# Patient Record
Sex: Female | Born: 1940
Health system: Southern US, Community
[De-identification: ages and names within clinical notes are randomized; demographics above are authoritative.]

## PROBLEM LIST (undated history)

## (undated) DIAGNOSIS — I10 Essential (primary) hypertension: Secondary | ICD-10-CM

## (undated) DIAGNOSIS — M1990 Unspecified osteoarthritis, unspecified site: Secondary | ICD-10-CM

## (undated) DIAGNOSIS — R7303 Prediabetes: Secondary | ICD-10-CM

## (undated) DIAGNOSIS — G709 Myoneural disorder, unspecified: Secondary | ICD-10-CM

## (undated) HISTORY — PX: CARPAL TUNNEL RELEASE: SHX101

## (undated) HISTORY — PX: APPENDECTOMY: SHX54

## (undated) HISTORY — PX: OTHER SURGICAL HISTORY: SHX169

---

## 2015-06-15 ENCOUNTER — Encounter (HOSPITAL_COMMUNITY): Payer: Self-pay | Admitting: Oncology

## 2015-06-15 ENCOUNTER — Emergency Department (HOSPITAL_COMMUNITY)
Admission: EM | Admit: 2015-06-15 | Discharge: 2015-06-15 | Disposition: A | Discharge status: Home / Self Care | Payer: Medicare Other | Attending: Emergency Medicine | Admitting: Emergency Medicine

## 2015-06-15 DIAGNOSIS — Y9289 Other specified places as the place of occurrence of the external cause: Secondary | ICD-10-CM | POA: Diagnosis not present

## 2015-06-15 DIAGNOSIS — I10 Essential (primary) hypertension: Secondary | ICD-10-CM | POA: Insufficient documentation

## 2015-06-15 DIAGNOSIS — W268XXA Contact with other sharp object(s), not elsewhere classified, initial encounter: Secondary | ICD-10-CM | POA: Diagnosis not present

## 2015-06-15 DIAGNOSIS — S61213A Laceration without foreign body of left middle finger without damage to nail, initial encounter: Secondary | ICD-10-CM | POA: Diagnosis not present

## 2015-06-15 DIAGNOSIS — Z23 Encounter for immunization: Secondary | ICD-10-CM | POA: Insufficient documentation

## 2015-06-15 DIAGNOSIS — Z87891 Personal history of nicotine dependence: Secondary | ICD-10-CM | POA: Insufficient documentation

## 2015-06-15 DIAGNOSIS — Z8739 Personal history of other diseases of the musculoskeletal system and connective tissue: Secondary | ICD-10-CM | POA: Insufficient documentation

## 2015-06-15 DIAGNOSIS — Y9389 Activity, other specified: Secondary | ICD-10-CM | POA: Diagnosis not present

## 2015-06-15 DIAGNOSIS — S61219A Laceration without foreign body of unspecified finger without damage to nail, initial encounter: Secondary | ICD-10-CM

## 2015-06-15 DIAGNOSIS — Y998 Other external cause status: Secondary | ICD-10-CM | POA: Insufficient documentation

## 2015-06-15 HISTORY — DX: Unspecified osteoarthritis, unspecified site: M19.90

## 2015-06-15 HISTORY — DX: Essential (primary) hypertension: I10

## 2015-06-15 MED ORDER — TETANUS-DIPHTH-ACELL PERTUSSIS 5-2.5-18.5 LF-MCG/0.5 IM SUSP
0.5000 mL | Freq: Once | INTRAMUSCULAR | Status: AC
  Administered 2015-06-15: 0.5 mL via INTRAMUSCULAR
  Filled 2015-06-15: qty 0.5

## 2015-06-15 NOTE — ED Provider Notes (Signed)
CSN: [id number]     Arrival date & time 06/15/15  1849 History   First MD Initiated Contact with Patient 06/15/15 1945     Chief Complaint  Patient presents with  . Extremity Laceration    left middle finger     (Consider location/radiation/quality/duration/timing/severity/associated sxs/prior Treatment) HPI 75 year old female who presents with finger laceration. Right-hand-dominant with history of hypertension and arthritis. Does not take blood thinners. I was cleaning the blade of a blender when she accidentally neck herself over the left middle finger. States that she initially had some bleeding to her finger which stopped with pressure. No weakness, numbness, or overlying color changes to her skin. She is otherwise been in her usual state of health. Past Medical History  Diagnosis Date  . Hypertension   . Arthritis    Past Surgical History  Procedure Laterality Date  . Carpal tunnel release Left    History reviewed. No pertinent family history. Social History  Substance Use Topics  . Smoking status: Former Games developer  . Smokeless tobacco: Never Used  . Alcohol Use: Yes   OB History    No data available     Review of Systems  Constitutional: Negative for fever.  Skin: Positive for wound.  Allergic/Immunologic: Negative for immunocompromised state.  Neurological: Negative for numbness.  Hematological: Does not bruise/bleed easily.  All other systems reviewed and are negative.     Allergies  Review of patient's allergies indicates no known allergies.  Home Medications   Prior to Admission medications   Not on File   BP 164/87 mmHg  Pulse 78  Temp(Src) 98 F (36.7 C) (Oral)  Resp 20  Ht  (1.626 m)  Wt 117 lb (53.071 kg)  BMI 20.07 kg/m2  SpO2 97% Physical Exam Physical Exam  Nursing note and vitals reviewed. Constitutional: Well developed, well nourished, non-toxic, and in no acute distress Head: Normocephalic and atraumatic.  Mouth/Throat:  Oropharynx is clear and moist.  Neck: Normal range of motion. Neck supple.  Cardiovascular: +2 radial pulse, normal capillary refill distally  Pulmonary/Chest: Effort normal   Abdominal: Soft. Musculoskeletal: Normal range of motion of left middle finger.   Neurological: Alert, no facial droop, fluent speech, in tact motor and sensation distal to wound of the left middle finger and of the radial/median/unlar nerves of left hand Skin: Skin is warm and dry. 2 cm superficial laceration to the lateral aspect of the left middle finger. Psychiatric: Cooperative  ED Course  .Marland KitchenLaceration Repair Date/Time: 06/15/2015 8:20 PM Performed by: Crista Curb DUO Authorized by: Crista Curb DUO Consent: Verbal consent obtained. Risks and benefits: risks, benefits and alternatives were discussed Consent given by: patient Patient identity confirmed: verbally with patient Time out: Immediately prior to procedure a "time out" was called to verify the correct patient, procedure, equipment, support staff and site/side marked as required. Body area: upper extremity Location details: left long finger Laceration length: 2 cm Foreign bodies: no foreign bodies Tendon involvement: none Nerve involvement: none Vascular damage: yes Patient sedated: no Preparation: Patient was prepped and draped in the usual sterile fashion. Irrigation solution: tap water Irrigation method: tap Amount of cleaning: standard Debridement: none Degree of undermining: none Skin closure: glue Approximation: close Approximation difficulty: simple Dressing: bandaid. Patient tolerance: Patient tolerated the procedure well with no immediate complications   (including critical care time) Labs Review Labs Reviewed - No data to display  Imaging Review No results found. I have personally reviewed and evaluated these images and lab results as  part of my medical decision-making.   EKG Interpretation None      MDM   Final diagnoses:   Finger laceration, initial encounter    75 year old female who presents with laceration to the left middle finger prior to arrival. She is well-appearing acute distress. Vital signs unremarkable. She has a very superficial 2 cm laceration to the lateral aspect of the left middle finger. Neurovascularly intact distally. Does not probe into tendinous structures. No foreign bodies. Approximated with Dermabond. Tetanus is updated and she does not room for her last tetanus. Wound care instructions are reviewed. She expressed understanding of all discharge instructions felt comfortable to plan of care.   Lavera Guiseana Duo Liu, MD 06/15/15 2021

## 2015-06-15 NOTE — Discharge Instructions (Signed)
Nonsutured Laceration Care °A laceration is a cut that goes through all layers of the skin and extends into the tissue that is right under the skin. This type of cut is usually stitched up (sutured) or closed with tape (adhesive strips) or skin glue shortly after the injury happens. °However, if the wound is dirty or if several hours pass before medical treatment is provided, it is likely that germs (bacteria) will enter the wound. Closing a laceration after bacteria have entered it increases the risk of infection. In these cases, your health care provider may leave the laceration open (nonsutured) and cover it with a bandage. This type of treatment helps prevent infection and allows the wound to heal from the deepest layer of tissue damage up to the surface. °An open fracture is a type of injury that may involve nonsutured lacerations. An open fracture is a break in a bone that happens along with one or more lacerations through the skin that is near the fracture site. °HOW TO CARE FOR YOUR NONSUTURED LACERATION °· Take or apply over-the-counter and prescription medicines only as told by your health care provider. °· If you were prescribed an antibiotic medicine, take or apply it as told by your health care provider. Do not stop using the antibiotic even if your condition improves. °· Clean the wound one time each day or as told by your health care provider. °¨ Wash the wound with mild soap and water. °¨ Rinse the wound with water to remove all soap. °¨ Pat your wound dry with a clean towel. Do not rub the wound. °· Do not inject anything into the wound unless your health care provider told you to. °· Change any bandages (dressings) as told by your health care provider. This includes changing the dressing if it gets wet, dirty, or starts to smell bad. °· Keep the dressing dry until your health care provider says it can be removed. Do not take baths, swim, or do anything that puts your wound underwater until your  health care provider approves. °· Raise (elevate) the injured area above the level of your heart while you are sitting or lying down, if possible. °· Do not scratch or pick at the wound. °· Check your wound every day for signs of infection. Watch for: °¨ Redness, swelling, or pain. °¨ Fluid, blood, or pus. °· Keep all follow-up visits as told by your health care provider. This is important. °SEEK MEDICAL CARE IF: °· You received a tetanus and shot and you have swelling, severe pain, redness, or bleeding at the injection site.   °· You have a fever. °· Your pain is not controlled with medicine. °· You have increased redness, swelling, or pain at the site of your wound. °· You have fluid, blood, or pus coming from your wound. °· You notice a bad smell coming from your wound or your dressing. °· You notice something coming out of the wound, such as wood or glass. °· You notice a change in the color of your skin near your wound. °· You develop a new rash. °· You need to change the dressing frequently due to fluid, blood, or pus draining from the wound. °· You develop numbness around your wound. °SEEK IMMEDIATE MEDICAL CARE IF: °· Your pain suddenly increases and is severe. °· You develop severe swelling around the wound. °· The wound is on your hand or foot and you cannot properly move a finger or toe. °· The wound is on your hand or   foot and you notice that your fingers or toes look pale or bluish. °· You have a red streak going away from your wound. °  °This information is not intended to replace advice given to you by your health care provider. Make sure you discuss any questions you have with your health care provider. °  °Document Released: 01/13/2006 Document Revised: 07/02/2014 Document Reviewed: 02/11/2014 °Elsevier Interactive Patient Education ©2016 [employer] ° °

## 2015-06-15 NOTE — ED Notes (Signed)
Pt has a laceration to her left middle finger d/t cleaning the blades of a blender.  Bleeding is controled at this time.

## 2017-03-01 HISTORY — PX: OTHER SURGICAL HISTORY: SHX169

## 2017-07-30 HISTORY — PX: OTHER SURGICAL HISTORY: SHX169

## 2018-04-06 NOTE — H&P (Signed)
TOTAL KNEE ADMISSION H&P  Patient is being admitted for left total knee arthroplasty, anterior approach.  Subjective:  Chief Complaint:    Left knee primary OA / pain  HPI: Monique Morse, 78 y.o. female, has a history of pain and functional disability in the left knee due to arthritis and has failed non-surgical conservative treatments for greater than 12 weeks to include NSAID's and/or analgesics, corticosteriod injections and activity modification.  Onset of symptoms was gradual, starting 2+ years ago with gradually worsening course since that time. The patient noted prior procedures on the knee to include  arthroplasty on the right knee in [facility] Morse.  Patient currently rates pain in the left knee(s) at 7 out of 10 with activity. Patient has worsening of pain with activity and weight bearing, pain that interferes with activities of daily living, pain with passive range of motion, crepitus and joint swelling.  Patient has evidence of periarticular osteophytes and joint space narrowing by imaging studies. There is no active infection.  Risks, benefits and expectations were discussed with the patient.  Risks including but not limited to the risk of anesthesia, blood clots, nerve damage, blood vessel damage, failure of the prosthesis, infection and up to and including death.  Patient understand the risks, benefits and expectations and wishes to proceed with surgery.   PCP: No primary care provider on file.  D/C Plans:       Home   Post-op Meds:       No Rx given  Tranexamic Acid:      To be given - IV   Decadron:      Is to be given  FYI:      ASA   Norco  DME:    Rx given for - RW & 3-n-1  PT:    OPPT   Past Medical History:  Diagnosis Date  . Arthritis   . Hypertension     Past Surgical History:  Procedure Laterality Date  . CARPAL TUNNEL RELEASE Left     No current facility-administered medications for this encounter.    No current outpatient medications on file.   No  Known Allergies   Social History   Tobacco Use  . Smoking status: Former Games developer  . Smokeless tobacco: Never Used  Substance Use Topics  . Alcohol use: Yes       Review of Systems  Constitutional: Negative.   HENT: Negative.   Eyes: Negative.   Respiratory: Negative.   Cardiovascular: Negative.   Gastrointestinal: Negative.   Genitourinary: Negative.   Musculoskeletal: Positive for joint pain.  Skin: Negative.   Neurological: Negative.   Endo/Heme/Allergies: Negative.   Psychiatric/Behavioral: Negative.     Objective:  Physical Exam  Constitutional: She is oriented to person, place, and time. She appears well-developed.  HENT:  Head: Normocephalic.  Eyes: Pupils are equal, round, and reactive to light.  Neck: Neck supple. No JVD present. No tracheal deviation present. No thyromegaly present.  Cardiovascular: Normal rate, regular rhythm and intact distal pulses.  Respiratory: Effort normal and breath sounds normal. No respiratory distress. She has no wheezes.  GI: Soft. There is no abdominal tenderness. There is no guarding.  Musculoskeletal:     Left knee: She exhibits decreased range of motion, swelling and bony tenderness. She exhibits no ecchymosis, no deformity, no laceration and no erythema. Tenderness found.  Lymphadenopathy:    She has no cervical adenopathy.  Neurological: She is alert and oriented to person, place, and time.  Skin: Skin  is warm and dry.  Psychiatric: She has a normal mood and affect.     Labs:  Estimated body mass index is 20.08 kg/m as calculated from the following:   Height as of 06/15/15: 5\' 4"  (1.626 m).   Weight as of 06/15/15: 53.1 kg.   Imaging Review Plain radiographs demonstrate severe degenerative joint disease of the left knee.  The bone quality appears to be good for age and reported activity level.      Assessment/Plan:  End stage arthritis, left knee   The patient history, physical examination, clinical judgment  of the provider and imaging studies are consistent with end stage degenerative joint disease of the left knee(s) and total knee arthroplasty is deemed medically necessary. The treatment options including medical management, injection therapy arthroscopy and arthroplasty were discussed at length. The risks and benefits of total knee arthroplasty were presented and reviewed. The risks due to aseptic loosening, infection, stiffness, patella tracking problems, thromboembolic complications and other imponderables were discussed. The patient acknowledged the explanation, agreed to proceed with the plan and consent was signed. Patient is being admitted for inpatient treatment for surgery, pain control, PT, OT, prophylactic antibiotics, VTE prophylaxis, progressive ambulation and ADL's and discharge planning. The patient is planning to be discharged home.     Patient's anticipated LOS is less than 2 midnights, meeting these requirements: - Lives within 1 hour of care - Has a competent adult at home to recover with post-op recover - NO history of  - Chronic pain requiring opiods  - Diabetes  - Coronary Artery Disease  - Heart failure  - Heart attack  - Stroke  - DVT/VTE  - Cardiac arrhythmia  - Respiratory Failure/COPD  - Renal failure  - Anemia  - Advanced Liver disease    Anastasio Auerbach. Neko Boyajian   PA-C  04/06/2018, 2:53 PM

## 2018-04-24 ENCOUNTER — Other Ambulatory Visit (HOSPITAL_COMMUNITY): Payer: Self-pay | Admitting: *Deleted

## 2018-04-24 NOTE — Patient Instructions (Signed)
Monique Morse    Your procedure is scheduled on: 05-04-2018  Report to [facility] Main  Entrance  Report to SHORT STAY at 530  AM    Call this number if you have problems the morning of surgery [phone]    Remember: Do not eat food or drink liquids :After Midnight. BRUSH YOUR TEETH MORNING OF SURGERY AND RINSE YOUR MOUTH OUT, NO CHEWING GUM CANDY OR MINTS.     Take these medicines the morning of surgery with A SIP OF WATER: NONE                              You may not have any metal on your body including hair pins and              piercings  Do not wear jewelry, make-up, lotions, powders or perfumes, deodorant             Do not wear nail polish.  Do not shave  48 hours prior to surgery.              Men may shave face and neck.   Do not bring valuables to the hospital. [place] IS NOT             RESPONSIBLE   FOR VALUABLES.  Contacts, dentures or bridgework may not be worn into surgery.  Leave suitcase in the car. After surgery it may be brought to your room.      _____________________________________________________________________             [facility] - Preparing for Surgery Before surgery, you can play an important role.  Because skin is not sterile, your skin needs to be as free of germs as possible.  You can reduce the number of germs on your skin by washing with CHG (chlorahexidine gluconate) soap before surgery.  CHG is an antiseptic cleaner which kills germs and bonds with the skin to continue killing germs even after washing. Please DO NOT use if you have an allergy to CHG or antibacterial soaps.  If your skin becomes reddened/irritated stop using the CHG and inform your nurse when you arrive at Short Stay. Do not shave (including legs and underarms) for at least 48 hours prior to the first CHG shower.  You may shave your face/neck. Please follow these instructions carefully:  1.  Shower with CHG Soap the night before surgery and  the  morning of Surgery.  2.  If you choose to wash your hair, wash your hair first as usual with your  normal  shampoo.  3.  After you shampoo, rinse your hair and body thoroughly to remove the  shampoo.                           4.  Use CHG as you would any other liquid soap.  You can apply chg directly  to the skin and wash                       Gently with a scrungie or clean washcloth.  5.  Apply the CHG Soap to your body ONLY FROM THE NECK DOWN.   Do not use on face/ open  Wound or open sores. Avoid contact with eyes, ears mouth and genitals (private parts).                       Wash face,  Genitals (private parts) with your normal soap.             6.  Wash thoroughly, paying special attention to the area where your surgery  will be performed.  7.  Thoroughly rinse your body with warm water from the neck down.  8.  DO NOT shower/wash with your normal soap after using and rinsing off  the CHG Soap.                9.  Pat yourself dry with a clean towel.            10.  Wear clean pajamas.            11.  Place clean sheets on your bed the night of your first shower and do not  sleep with pets. Day of Surgery : Do not apply any lotions/deodorants the morning of surgery.  Please wear clean clothes to the hospital/surgery center.  FAILURE TO FOLLOW THESE INSTRUCTIONS MAY RESULT IN THE CANCELLATION OF YOUR SURGERY PATIENT SIGNATURE_________________________________  NURSE SIGNATURE__________________________________  ________________________________________________________________________   Adam Phenix  An incentive spirometer is a tool that can help keep your lungs clear and active. This tool measures how well you are filling your lungs with each breath. Taking long deep breaths may help reverse or decrease the chance of developing breathing (pulmonary) problems (especially infection) following:  A long period of time when you are unable to move or be  active. BEFORE THE PROCEDURE   If the spirometer includes an indicator to show your best effort, your nurse or respiratory therapist will set it to a desired goal.  If possible, sit up straight or lean slightly forward. Try not to slouch.  Hold the incentive spirometer in an upright position. INSTRUCTIONS FOR USE  1. Sit on the edge of your bed if possible, or sit up as far as you can in bed or on a chair. 2. Hold the incentive spirometer in an upright position. 3. Breathe out normally. 4. Place the mouthpiece in your mouth and seal your lips tightly around it. 5. Breathe in slowly and as deeply as possible, raising the piston or the ball toward the top of the column. 6. Hold your breath for 3-5 seconds or for as long as possible. Allow the piston or ball to fall to the bottom of the column. 7. Remove the mouthpiece from your mouth and breathe out normally. 8. Rest for a few seconds and repeat Steps 1 through 7 at least 10 times every 1-2 hours when you are awake. Take your time and take a few normal breaths between deep breaths. 9. The spirometer may include an indicator to show your best effort. Use the indicator as a goal to work toward during each repetition. 10. After each set of 10 deep breaths, practice coughing to be sure your lungs are clear. If you have an incision (the cut made at the time of surgery), support your incision when coughing by placing a pillow or rolled up towels firmly against it. Once you are able to get out of bed, walk around indoors and cough well. You may stop using the incentive spirometer when instructed by your caregiver.  RISKS AND COMPLICATIONS  Take your time so you do not get  dizzy or light-headed.  If you are in pain, you may need to take or ask for pain medication before doing incentive spirometry. It is harder to take a deep breath if you are having pain. AFTER USE  Rest and breathe slowly and easily.  It can be helpful to keep track of a log of  your progress. Your caregiver can provide you with a simple table to help with this. If you are using the spirometer at home, follow these instructions: [place] IF:   You are having difficultly using the spirometer.  You have trouble using the spirometer as often as instructed.  Your pain medication is not giving enough relief while using the spirometer.  You develop fever of 100.5 F (38.1 C) or higher. SEEK IMMEDIATE MEDICAL CARE IF:   You cough up bloody sputum that had not been present before.  You develop fever of 102 F (38.9 C) or greater.  You develop worsening pain at or near the incision site. MAKE SURE YOU:   Understand these instructions.  Will watch your condition.  Will get help right away if you are not doing well or get worse. Document Released: 06/28/2006 Document Revised: 05/10/2011 Document Reviewed: 08/29/2006 Exit[employer] Patient Information 2014 [employer], [place].   ________________________________________________________________________  WHAT IS A BLOOD TRANSFUSION? Blood Transfusion Information  A transfusion is the replacement of blood or some of its parts. Blood is made up of multiple cells which provide different functions.  Red blood cells carry oxygen and are used for blood loss replacement.  White blood cells fight against infection.  Platelets control bleeding.  Plasma helps clot blood.  Other blood products are available for specialized needs, such as hemophilia or other clotting disorders. BEFORE THE TRANSFUSION  Who gives blood for transfusions?   Healthy volunteers who are fully evaluated to make sure their blood is safe. This is blood bank blood. Transfusion therapy is the safest it has ever been in the practice of medicine. Before blood is taken from a donor, a complete history is taken to make sure that person has no history of diseases nor engages in risky social behavior (examples are intravenous drug use or sexual activity  with multiple partners). The donor's travel history is screened to minimize risk of transmitting infections, such as malaria. The donated blood is tested for signs of infectious diseases, such as HIV and hepatitis. The blood is then tested to be sure it is compatible with you in order to minimize the chance of a transfusion reaction. If you or a relative donates blood, this is often done in anticipation of surgery and is not appropriate for emergency situations. It takes many days to process the donated blood. RISKS AND COMPLICATIONS Although transfusion therapy is very safe and saves many lives, the main dangers of transfusion include:   Getting an infectious disease.  Developing a transfusion reaction. This is an allergic reaction to something in the blood you were given. Every precaution is taken to prevent this. The decision to have a blood transfusion has been considered carefully by your caregiver before blood is given. Blood is not given unless the benefits outweigh the risks. AFTER THE TRANSFUSION  Right after receiving a blood transfusion, you will usually feel much better and more energetic. This is especially true if your red blood cells have gotten low (anemic). The transfusion raises the level of the red blood cells which carry oxygen, and this usually causes an energy increase.  The nurse administering the transfusion will  monitor you carefully for complications. HOME CARE INSTRUCTIONS  No special instructions are needed after a transfusion. You may find your energy is better. Speak with your caregiver about any limitations on activity for underlying diseases you may have. SEEK MEDICAL CARE IF:   Your condition is not improving after your transfusion.  You develop redness or irritation at the intravenous (IV) site. SEEK IMMEDIATE MEDICAL CARE IF:  Any of the following symptoms occur over the next 12 hours:  Shaking chills.  You have a temperature by mouth above 102 F (38.9  C), not controlled by medicine.  Chest, back, or muscle pain.  People around you feel you are not acting correctly or are confused.  Shortness of breath or difficulty breathing.  Dizziness and fainting.  You get a rash or develop hives.  You have a decrease in urine output.  Your urine turns a dark color or changes to pink, red, or brown. Any of the following symptoms occur over the next 10 days:  You have a temperature by mouth above 102 F (38.9 C), not controlled by medicine.  Shortness of breath.  Weakness after normal activity.  The white part of the eye turns yellow (jaundice).  You have a decrease in the amount of urine or are urinating less often.  Your urine turns a dark color or changes to pink, red, or brown. Document Released: 02/13/2000 Document Revised: 05/10/2011 Document Reviewed: 10/02/2007 [employer] Patient Information 2014 [place], [place].  _______________________________________________________________________

## 2018-04-27 ENCOUNTER — Encounter (HOSPITAL_COMMUNITY)
Admission: RE | Admit: 2018-04-27 | Discharge: 2018-04-27 | Disposition: A | Discharge status: Home / Self Care | Payer: Medicare Other | Source: Ambulatory Visit | Attending: Orthopedic Surgery | Admitting: Orthopedic Surgery

## 2018-04-27 ENCOUNTER — Other Ambulatory Visit: Payer: Self-pay

## 2018-04-27 ENCOUNTER — Encounter (HOSPITAL_COMMUNITY): Payer: Self-pay

## 2018-04-27 ENCOUNTER — Inpatient Hospital Stay (HOSPITAL_COMMUNITY): Admission: RE | Admit: 2018-04-27 | Payer: Medicare Other | Source: Ambulatory Visit

## 2018-04-27 DIAGNOSIS — Z01812 Encounter for preprocedural laboratory examination: Secondary | ICD-10-CM | POA: Insufficient documentation

## 2018-04-27 DIAGNOSIS — M1712 Unilateral primary osteoarthritis, left knee: Secondary | ICD-10-CM | POA: Insufficient documentation

## 2018-04-27 LAB — CBC
HCT: 40.3 % (ref 36.0–46.0)
HEMOGLOBIN: 12.7 g/dL (ref 12.0–15.0)
MCH: 29.6 pg (ref 26.0–34.0)
MCHC: 31.5 g/dL (ref 30.0–36.0)
MCV: 93.9 fL (ref 80.0–100.0)
Platelets: 357 10*3/uL (ref 150–400)
RBC: 4.29 MIL/uL (ref 3.87–5.11)
RDW: 12.3 % (ref 11.5–15.5)
WBC: 8.5 10*3/uL (ref 4.0–10.5)
nRBC: 0 % (ref 0.0–0.2)

## 2018-04-27 LAB — SURGICAL PCR SCREEN
MRSA, PCR: NEGATIVE
Staphylococcus aureus: NEGATIVE

## 2018-04-27 LAB — ABO/RH: ABO/RH(D): O POS

## 2018-04-27 LAB — BASIC METABOLIC PANEL
Anion gap: 10 (ref 5–15)
BUN: 15 mg/dL (ref 8–23)
CALCIUM: 9.1 mg/dL (ref 8.9–10.3)
CHLORIDE: 104 mmol/L (ref 98–111)
CO2: 24 mmol/L (ref 22–32)
CREATININE: 0.78 mg/dL (ref 0.44–1.00)
GFR calc Af Amer: >60 mL/min (ref >60)
GFR calc non Af Amer: >60 mL/min (ref >60)
Glucose, Bld: 110 mg/dL — ABNORMAL HIGH (ref 70–99)
Potassium: 4.8 mmol/L (ref 3.5–5.1)
SODIUM: 138 mmol/L (ref 135–145)

## 2018-04-27 NOTE — Progress Notes (Signed)
PATIENT INSTRUCTED TO STOP ASPIRIN 81 MG 7 DAYS BEFORE SURGERY PER DR Marcheta Grammes

## 2018-05-03 NOTE — Anesthesia Preprocedure Evaluation (Addendum)
Anesthesia Evaluation  Patient identified by MRN, date of birth, ID band Patient awake    Reviewed: Allergy & Precautions, NPO status , Patient's Chart, lab work & pertinent test results  Airway Mallampati: II  TM Distance: >3 FB Neck ROM: Full    Dental no notable dental hx.    Pulmonary former smoker,    Pulmonary exam normal breath sounds clear to auscultation       Cardiovascular hypertension, Pt. on medications Normal cardiovascular exam Rhythm:Regular Rate:Normal  ECG: Normal   Neuro/Psych negative neurological ROS  negative psych ROS   GI/Hepatic negative GI ROS, Neg liver ROS,   Endo/Other  negative endocrine ROS  Renal/GU negative Renal ROS     Musculoskeletal  (+) Arthritis ,   Abdominal   Peds  Hematology negative hematology ROS (+)   Anesthesia Other Findings Left knee osteoarthritis  Reproductive/Obstetrics                            Anesthesia Physical Anesthesia Plan  ASA: II  Anesthesia Plan: Regional and Spinal   Post-op Pain Management:    Induction:   PONV Risk Score and Plan: 2 and Ondansetron, Dexamethasone, Treatment may vary due to age or medical condition and Midazolam  Airway Management Planned: Natural Airway  Additional Equipment:   Intra-op Plan:   Post-operative Plan:   Informed Consent: I have reviewed the patients History and Physical, chart, labs and discussed the procedure including the risks, benefits and alternatives for the proposed anesthesia with the patient or authorized representative who has indicated his/her understanding and acceptance.     Dental advisory given  Plan Discussed with: CRNA  Anesthesia Plan Comments:        Anesthesia Quick Evaluation

## 2018-05-04 ENCOUNTER — Encounter (HOSPITAL_COMMUNITY)
Admission: RE | Disposition: A | Discharge status: Home / Self Care | Payer: Self-pay | Source: Other Acute Inpatient Hospital | Attending: Orthopedic Surgery

## 2018-05-04 ENCOUNTER — Inpatient Hospital Stay (HOSPITAL_COMMUNITY): Payer: Medicare Other | Admitting: Anesthesiology

## 2018-05-04 ENCOUNTER — Other Ambulatory Visit: Payer: Self-pay

## 2018-05-04 ENCOUNTER — Encounter (HOSPITAL_COMMUNITY): Payer: Self-pay | Admitting: *Deleted

## 2018-05-04 ENCOUNTER — Observation Stay (HOSPITAL_COMMUNITY)
Admission: RE | Admit: 2018-05-04 | Discharge: 2018-05-05 | Disposition: A | Discharge status: Home / Self Care | Payer: Medicare Other | Source: Other Acute Inpatient Hospital | Attending: Orthopedic Surgery | Admitting: Orthopedic Surgery

## 2018-05-04 ENCOUNTER — Inpatient Hospital Stay (HOSPITAL_COMMUNITY): Payer: Medicare Other | Admitting: Physician Assistant

## 2018-05-04 DIAGNOSIS — I10 Essential (primary) hypertension: Secondary | ICD-10-CM | POA: Diagnosis not present

## 2018-05-04 DIAGNOSIS — M1712 Unilateral primary osteoarthritis, left knee: Principal | ICD-10-CM | POA: Insufficient documentation

## 2018-05-04 DIAGNOSIS — Z79899 Other long term (current) drug therapy: Secondary | ICD-10-CM | POA: Diagnosis not present

## 2018-05-04 DIAGNOSIS — R262 Difficulty in walking, not elsewhere classified: Secondary | ICD-10-CM | POA: Insufficient documentation

## 2018-05-04 DIAGNOSIS — Z87891 Personal history of nicotine dependence: Secondary | ICD-10-CM | POA: Insufficient documentation

## 2018-05-04 DIAGNOSIS — Z7982 Long term (current) use of aspirin: Secondary | ICD-10-CM | POA: Diagnosis not present

## 2018-05-04 DIAGNOSIS — Z96652 Presence of left artificial knee joint: Secondary | ICD-10-CM

## 2018-05-04 HISTORY — PX: TOTAL KNEE ARTHROPLASTY: SHX125

## 2018-05-04 LAB — TYPE AND SCREEN
ABO/RH(D): O POS
Antibody Screen: NEGATIVE

## 2018-05-04 SURGERY — ARTHROPLASTY, KNEE, TOTAL
Anesthesia: Regional | Site: Knee | Laterality: Left

## 2018-05-04 MED ORDER — BUPIVACAINE IN DEXTROSE 0.75-8.25 % IT SOLN
INTRATHECAL | Status: DC | PRN
  Administered 2018-05-04: 1.6 mL via INTRATHECAL

## 2018-05-04 MED ORDER — MIDAZOLAM HCL 2 MG/2ML IJ SOLN
INTRAMUSCULAR | Status: AC
  Filled 2018-05-04: qty 2

## 2018-05-04 MED ORDER — ACETAMINOPHEN 325 MG PO TABS: 325.0000 mg | ORAL_TABLET | Freq: Four times a day (QID) | ORAL | Status: DC | PRN

## 2018-05-04 MED ORDER — DIPHENHYDRAMINE HCL 12.5 MG/5ML PO ELIX: 12.5000 mg | ORAL_SOLUTION | ORAL | Status: DC | PRN

## 2018-05-04 MED ORDER — METOCLOPRAMIDE HCL 5 MG PO TABS: 5.0000 mg | ORAL_TABLET | Freq: Three times a day (TID) | ORAL | Status: DC | PRN

## 2018-05-04 MED ORDER — BUPIVACAINE HCL (PF) 0.25 % IJ SOLN
INTRAMUSCULAR | Status: AC
  Filled 2018-05-04: qty 30

## 2018-05-04 MED ORDER — METHOCARBAMOL 500 MG PO TABS
500.0000 mg | ORAL_TABLET | Freq: Four times a day (QID) | ORAL | Status: DC | PRN
  Administered 2018-05-04 (×2): 500 mg via ORAL
  Filled 2018-05-04 (×2): qty 1

## 2018-05-04 MED ORDER — SODIUM CHLORIDE (PF) 0.9 % IJ SOLN
INTRAMUSCULAR | Status: DC | PRN
  Administered 2018-05-04: 30 mL

## 2018-05-04 MED ORDER — DEXAMETHASONE SODIUM PHOSPHATE 10 MG/ML IJ SOLN
10.0000 mg | Freq: Once | INTRAMUSCULAR | Status: AC
  Administered 2018-05-04: 10 mg via INTRAVENOUS

## 2018-05-04 MED ORDER — ONDANSETRON HCL 4 MG PO TABS: 4.0000 mg | ORAL_TABLET | Freq: Four times a day (QID) | ORAL | Status: DC | PRN

## 2018-05-04 MED ORDER — METOCLOPRAMIDE HCL 5 MG/ML IJ SOLN: 5.0000 mg | Freq: Three times a day (TID) | INTRAMUSCULAR | Status: DC | PRN

## 2018-05-04 MED ORDER — ONDANSETRON HCL 4 MG/2ML IJ SOLN: 4.0000 mg | Freq: Once | INTRAMUSCULAR | Status: DC | PRN

## 2018-05-04 MED ORDER — FERROUS SULFATE 325 (65 FE) MG PO TABS
325.0000 mg | ORAL_TABLET | Freq: Two times a day (BID) | ORAL | Status: DC
  Administered 2018-05-05: 325 mg via ORAL
  Filled 2018-05-04: qty 1

## 2018-05-04 MED ORDER — CHLORHEXIDINE GLUCONATE 4 % EX LIQD: 60.0000 mL | Freq: Once | CUTANEOUS | Status: DC

## 2018-05-04 MED ORDER — SODIUM CHLORIDE 0.9 % IV SOLN
INTRAVENOUS | Status: DC
  Administered 2018-05-04 (×3): via INTRAVENOUS

## 2018-05-04 MED ORDER — SODIUM CHLORIDE 0.9 % IR SOLN
Status: DC | PRN
  Administered 2018-05-04 (×2): 1000 mL

## 2018-05-04 MED ORDER — LACTATED RINGERS IV SOLN
INTRAVENOUS | Status: DC
  Administered 2018-05-04 (×2): via INTRAVENOUS

## 2018-05-04 MED ORDER — KETOROLAC TROMETHAMINE 30 MG/ML IJ SOLN
INTRAMUSCULAR | Status: AC
  Filled 2018-05-04: qty 1

## 2018-05-04 MED ORDER — ONDANSETRON HCL 4 MG/2ML IJ SOLN
INTRAMUSCULAR | Status: DC | PRN
  Administered 2018-05-04: 4 mg via INTRAVENOUS

## 2018-05-04 MED ORDER — BUPIVACAINE-EPINEPHRINE (PF) 0.25% -1:200000 IJ SOLN
INTRAMUSCULAR | Status: DC | PRN
  Administered 2018-05-04: 30 mL

## 2018-05-04 MED ORDER — FENTANYL CITRATE (PF) 100 MCG/2ML IJ SOLN
INTRAMUSCULAR | Status: DC | PRN
  Administered 2018-05-04 (×2): 50 ug via INTRAVENOUS

## 2018-05-04 MED ORDER — BISACODYL 10 MG RE SUPP: 10.0000 mg | Freq: Every day | RECTAL | Status: DC | PRN

## 2018-05-04 MED ORDER — PHENOL 1.4 % MT LIQD
1.0000 | OROMUCOSAL | Status: DC | PRN
  Filled 2018-05-04: qty 177

## 2018-05-04 MED ORDER — PROPOFOL 10 MG/ML IV BOLUS
INTRAVENOUS | Status: AC
  Filled 2018-05-04: qty 60

## 2018-05-04 MED ORDER — FENTANYL CITRATE (PF) 100 MCG/2ML IJ SOLN
INTRAMUSCULAR | Status: AC
  Filled 2018-05-04: qty 2

## 2018-05-04 MED ORDER — KETOROLAC TROMETHAMINE 30 MG/ML IJ SOLN
INTRAMUSCULAR | Status: DC | PRN
  Administered 2018-05-04: 30 mg

## 2018-05-04 MED ORDER — POLYETHYLENE GLYCOL 3350 17 G PO PACK: 17.0000 g | PACK | Freq: Two times a day (BID) | ORAL | Status: DC

## 2018-05-04 MED ORDER — MIDAZOLAM HCL 5 MG/5ML IJ SOLN
INTRAMUSCULAR | Status: DC | PRN
  Administered 2018-05-04 (×2): 1 mg via INTRAVENOUS

## 2018-05-04 MED ORDER — CELECOXIB 200 MG PO CAPS
200.0000 mg | ORAL_CAPSULE | Freq: Two times a day (BID) | ORAL | Status: DC
  Administered 2018-05-04 – 2018-05-05 (×2): 200 mg via ORAL
  Filled 2018-05-04 (×2): qty 1

## 2018-05-04 MED ORDER — METHOCARBAMOL 500 MG IVPB - SIMPLE MED
INTRAVENOUS | Status: AC
  Filled 2018-05-04: qty 50

## 2018-05-04 MED ORDER — CEFAZOLIN SODIUM-DEXTROSE 2-4 GM/100ML-% IV SOLN
2.0000 g | Freq: Four times a day (QID) | INTRAVENOUS | Status: AC
  Administered 2018-05-04 (×2): 2 g via INTRAVENOUS
  Filled 2018-05-04 (×2): qty 100

## 2018-05-04 MED ORDER — DEXAMETHASONE SODIUM PHOSPHATE 10 MG/ML IJ SOLN
10.0000 mg | Freq: Once | INTRAMUSCULAR | Status: AC
  Administered 2018-05-05: 10 mg via INTRAVENOUS
  Filled 2018-05-04: qty 1

## 2018-05-04 MED ORDER — HYDROCODONE-ACETAMINOPHEN 7.5-325 MG PO TABS: 1.0000 | ORAL_TABLET | ORAL | Status: DC | PRN

## 2018-05-04 MED ORDER — CEFAZOLIN SODIUM-DEXTROSE 2-4 GM/100ML-% IV SOLN
2.0000 g | INTRAVENOUS | Status: AC
  Administered 2018-05-04: 2 g via INTRAVENOUS
  Filled 2018-05-04: qty 100

## 2018-05-04 MED ORDER — METHOCARBAMOL 500 MG IVPB - SIMPLE MED
500.0000 mg | Freq: Four times a day (QID) | INTRAVENOUS | Status: DC | PRN
  Administered 2018-05-04: 500 mg via INTRAVENOUS
  Filled 2018-05-04: qty 50

## 2018-05-04 MED ORDER — FENTANYL CITRATE (PF) 100 MCG/2ML IJ SOLN: 25.0000 ug | INTRAMUSCULAR | Status: DC | PRN

## 2018-05-04 MED ORDER — ROPIVACAINE HCL 5 MG/ML IJ SOLN
INTRAMUSCULAR | Status: DC | PRN
  Administered 2018-05-04: 30 mL via PERINEURAL

## 2018-05-04 MED ORDER — DEXAMETHASONE SODIUM PHOSPHATE 10 MG/ML IJ SOLN
INTRAMUSCULAR | Status: AC
  Filled 2018-05-04: qty 1

## 2018-05-04 MED ORDER — ONDANSETRON HCL 4 MG/2ML IJ SOLN
INTRAMUSCULAR | Status: AC
  Filled 2018-05-04: qty 2

## 2018-05-04 MED ORDER — MENTHOL 3 MG MT LOZG: 1.0000 | LOZENGE | OROMUCOSAL | Status: DC | PRN

## 2018-05-04 MED ORDER — ACETAMINOPHEN 500 MG PO TABS
1000.0000 mg | ORAL_TABLET | Freq: Once | ORAL | Status: AC
  Administered 2018-05-04: 1000 mg via ORAL
  Filled 2018-05-04: qty 2

## 2018-05-04 MED ORDER — ALUM & MAG HYDROXIDE-SIMETH 200-200-20 MG/5ML PO SUSP: 15.0000 mL | ORAL | Status: DC | PRN

## 2018-05-04 MED ORDER — DOCUSATE SODIUM 100 MG PO CAPS
100.0000 mg | ORAL_CAPSULE | Freq: Two times a day (BID) | ORAL | Status: DC
  Administered 2018-05-05: 100 mg via ORAL
  Filled 2018-05-04: qty 1

## 2018-05-04 MED ORDER — HYDROMORPHONE HCL 1 MG/ML IJ SOLN: 0.5000 mg | INTRAMUSCULAR | Status: DC | PRN

## 2018-05-04 MED ORDER — SODIUM CHLORIDE 0.9 % IV SOLN
INTRAVENOUS | Status: DC | PRN
  Administered 2018-05-04: 25 ug/min via INTRAVENOUS

## 2018-05-04 MED ORDER — MAGNESIUM CITRATE PO SOLN: 1.0000 | Freq: Once | ORAL | Status: DC | PRN

## 2018-05-04 MED ORDER — ONDANSETRON HCL 4 MG/2ML IJ SOLN: 4.0000 mg | Freq: Four times a day (QID) | INTRAMUSCULAR | Status: DC | PRN

## 2018-05-04 MED ORDER — ASPIRIN 81 MG PO CHEW
81.0000 mg | CHEWABLE_TABLET | Freq: Two times a day (BID) | ORAL | Status: DC
  Administered 2018-05-04 – 2018-05-05 (×2): 81 mg via ORAL
  Filled 2018-05-04 (×2): qty 1

## 2018-05-04 MED ORDER — TRANEXAMIC ACID-NACL 1000-0.7 MG/100ML-% IV SOLN
1000.0000 mg | INTRAVENOUS | Status: AC
  Administered 2018-05-04: 1000 mg via INTRAVENOUS
  Filled 2018-05-04: qty 100

## 2018-05-04 MED ORDER — HYDROCODONE-ACETAMINOPHEN 5-325 MG PO TABS
1.0000 | ORAL_TABLET | ORAL | Status: DC | PRN
  Administered 2018-05-04 – 2018-05-05 (×2): 1 via ORAL
  Filled 2018-05-04 (×3): qty 1

## 2018-05-04 MED ORDER — TRANEXAMIC ACID-NACL 1000-0.7 MG/100ML-% IV SOLN
1000.0000 mg | Freq: Once | INTRAVENOUS | Status: AC
  Administered 2018-05-04: 1000 mg via INTRAVENOUS
  Filled 2018-05-04: qty 100

## 2018-05-04 MED ORDER — PROPOFOL 500 MG/50ML IV EMUL
INTRAVENOUS | Status: DC | PRN
  Administered 2018-05-04: 75 ug/kg/min via INTRAVENOUS

## 2018-05-04 MED ORDER — SODIUM CHLORIDE (PF) 0.9 % IJ SOLN
INTRAMUSCULAR | Status: AC
  Filled 2018-05-04: qty 50

## 2018-05-04 SURGICAL SUPPLY — 61 items
ATTUNE MED ANAT PAT 35 KNEE (Knees) ×2 IMPLANT
ATTUNE MED ANAT PAT 35MM KNEE (Knees) ×1 IMPLANT
ATTUNE PSFEM LTSZ4 NARCEM KNEE (Femur) ×3 IMPLANT
ATTUNE PSRP INSR SZ4 8 KNEE (Insert) ×2 IMPLANT
ATTUNE PSRP INSR SZ4 8MM KNEE (Insert) ×1 IMPLANT
BAG ZIPLOCK 12X15 (MISCELLANEOUS) IMPLANT
BANDAGE ACE 6X5 VEL STRL LF (GAUZE/BANDAGES/DRESSINGS) ×3 IMPLANT
BASEPLATE TIBIAL ROTATING SZ 4 (Knees) ×3 IMPLANT
BLADE SAW SGTL 11.0X1.19X90.0M (BLADE) IMPLANT
BLADE SAW SGTL 13.0X1.19X90.0M (BLADE) ×3 IMPLANT
BLADE SURG SZ10 CARB STEEL (BLADE) ×6 IMPLANT
BOWL SMART MIX CTS (DISPOSABLE) ×3 IMPLANT
CEMENT HV SMART SET (Cement) ×6 IMPLANT
CHLORAPREP W/TINT 26ML (MISCELLANEOUS) ×6 IMPLANT
COVER SURGICAL LIGHT HANDLE (MISCELLANEOUS) ×3 IMPLANT
COVER WAND RF STERILE (DRAPES) IMPLANT
CUFF TOURN SGL QUICK 34 (TOURNIQUET CUFF) ×2
CUFF TRNQT CYL 34X4.125X (TOURNIQUET CUFF) ×1 IMPLANT
DECANTER SPIKE VIAL GLASS SM (MISCELLANEOUS) ×6 IMPLANT
DERMABOND ADVANCED (GAUZE/BANDAGES/DRESSINGS) ×2
DERMABOND ADVANCED .7 DNX12 (GAUZE/BANDAGES/DRESSINGS) ×1 IMPLANT
DRAPE U-SHAPE 47X51 STRL (DRAPES) ×3 IMPLANT
DRESSING AQUACEL AG SP 3.5X10 (GAUZE/BANDAGES/DRESSINGS) ×1 IMPLANT
DRSG AQUACEL AG SP 3.5X10 (GAUZE/BANDAGES/DRESSINGS) ×3
DURAPREP 26ML APPLICATOR (WOUND CARE) IMPLANT
ELECT REM PT RETURN 15FT ADLT (MISCELLANEOUS) ×3 IMPLANT
GLOVE BIO SURGEON STRL SZ 6 (GLOVE) ×3 IMPLANT
GLOVE BIOGEL PI IND STRL 6.5 (GLOVE) ×1 IMPLANT
GLOVE BIOGEL PI IND STRL 7.5 (GLOVE) ×1 IMPLANT
GLOVE BIOGEL PI IND STRL 8.5 (GLOVE) ×1 IMPLANT
GLOVE BIOGEL PI INDICATOR 6.5 (GLOVE) ×2
GLOVE BIOGEL PI INDICATOR 7.5 (GLOVE) ×2
GLOVE BIOGEL PI INDICATOR 8.5 (GLOVE) ×2
GLOVE ECLIPSE 8.0 STRL XLNG CF (GLOVE) ×3 IMPLANT
GLOVE ORTHO TXT STRL SZ7.5 (GLOVE) ×3 IMPLANT
GOWN STRL REUS W/ TWL LRG LVL3 (GOWN DISPOSABLE) ×1 IMPLANT
GOWN STRL REUS W/TWL 2XL LVL3 (GOWN DISPOSABLE) ×3 IMPLANT
GOWN STRL REUS W/TWL LRG LVL3 (GOWN DISPOSABLE) ×5 IMPLANT
HANDPIECE INTERPULSE COAX TIP (DISPOSABLE) ×2
HOLDER FOLEY CATH W/STRAP (MISCELLANEOUS) ×3 IMPLANT
MANIFOLD NEPTUNE II (INSTRUMENTS) ×3 IMPLANT
NDL SAFETY ECLIPSE 18X1.5 (NEEDLE) ×1 IMPLANT
NEEDLE HYPO 18GX1.5 SHARP (NEEDLE) ×2
NS IRRIG 1000ML POUR BTL (IV SOLUTION) ×3 IMPLANT
PACK TOTAL KNEE CUSTOM (KITS) ×3 IMPLANT
PIN FIX SIGMA HP QUICK REL (PIN) ×3 IMPLANT
PIN THREADED HEADED SIGMA (PIN) ×3 IMPLANT
PROTECTOR NERVE ULNAR (MISCELLANEOUS) ×3 IMPLANT
SET HNDPC FAN SPRY TIP SCT (DISPOSABLE) ×1 IMPLANT
SET PAD KNEE POSITIONER (MISCELLANEOUS) ×3 IMPLANT
SUT MNCRL AB 4-0 PS2 18 (SUTURE) ×3 IMPLANT
SUT STRATAFIX PDS+ 0 24IN (SUTURE) ×3 IMPLANT
SUT VIC AB 1 CT1 36 (SUTURE) ×3 IMPLANT
SUT VIC AB 2-0 CT1 27 (SUTURE) ×6
SUT VIC AB 2-0 CT1 TAPERPNT 27 (SUTURE) ×3 IMPLANT
SYR 3ML LL SCALE MARK (SYRINGE) ×3 IMPLANT
TRAY FOLEY BAG SILVER LF 14FR (CATHETERS) ×3 IMPLANT
TRAY FOLEY MTR SLVR 16FR STAT (SET/KITS/TRAYS/PACK) IMPLANT
WATER STERILE IRR 1000ML POUR (IV SOLUTION) ×6 IMPLANT
WRAP KNEE MAXI GEL POST OP (GAUZE/BANDAGES/DRESSINGS) ×3 IMPLANT
YANKAUER SUCT BULB TIP 10FT TU (MISCELLANEOUS) ×3 IMPLANT

## 2018-05-04 NOTE — Op Note (Signed)
NAME:  Monique Morse                      MEDICAL RECORD NO.:  [id number]                             FACILITY:  [facility]      PHYSICIAN:  Madlyn Frankel. Charlann Boxer, M.D.  DATE OF BIRTH:  09/08/1940      DATE OF PROCEDURE:  05/04/2018                                     OPERATIVE REPORT         PREOPERATIVE DIAGNOSIS:  Left knee osteoarthritis.      POSTOPERATIVE DIAGNOSIS:  Left knee osteoarthritis.      FINDINGS:  The patient was noted to have complete loss of cartilage and   bone-on-bone arthritis with associated osteophytes in the medial and patellofemoral compartments of   the knee with a significant synovitis and associated effusion.  The patient had failed months of conservative treatment including medications, injection therapy, activity modification.     PROCEDURE:  Left total knee replacement.      COMPONENTS USED:  DePuy Attune rotating platform posterior stabilized knee   system, a size 4n femur, 4 tibia, size 8 mm PS AOX insert, and 35 anatomic patellar   button.      SURGEON:  Madlyn Frankel. Charlann Boxer, M.D.      ASSISTANT:  Lanney Gins, PA-C.      ANESTHESIA:  Regional and Spinal.      SPECIMENS:  None.      COMPLICATION:  None.      DRAINS:  None.  EBL: <100cc      TOURNIQUET TIME:   Total Tourniquet Time Documented: Thigh (Left) - 25 minutes Total: Thigh (Left) - 25 minutes  .      The patient was stable to the recovery room.      INDICATION FOR PROCEDURE:  Monique Morse is a 78 y.o. female patient of   mine.  The patient had been seen, evaluated, and treated for months conservatively in the   office with medication, activity modification, and injections.  The patient had   radiographic changes of bone-on-bone arthritis with endplate sclerosis and osteophytes noted.  Based on the radiographic changes and failed conservative measures, the patient   decided to proceed with definitive treatment, total knee replacement.  Risks of infection, DVT, component failure,  need for revision surgery, neurovascular injury were reviewed in the office setting.  The postop course was reviewed stressing the efforts to maximize post-operative satisfaction and function.  Consent was obtained for benefit of pain   relief.      PROCEDURE IN DETAIL:  The patient was brought to the operative theater.   Once adequate anesthesia, preoperative antibiotics, 2 gm of Ancef,1 gm of Tranexamic Acid, and 10 mg of Decadron administered, the patient was positioned supine with a left thigh tourniquet placed.  The  left lower extremity was prepped and draped in sterile fashion.  A time-   out was performed identifying the patient, planned procedure, and the appropriate extremity.      The left lower extremity was placed in the [facility] leg holder.  The leg was   exsanguinated, tourniquet elevated to 250 mmHg.  A midline incision was   made  followed by median parapatellar arthrotomy.  Following initial   exposure, attention was first directed to the patella.  Precut   measurement was noted to be 21 mm.  I resected down to 13-14 mm and used a   35 anatomic patellar button to restore patellar height as well as cover the cut surface.      The lug holes were drilled and a metal shim was placed to protect the   patella from retractors and saw blade during the procedure.      At this point, attention was now directed to the femur.  The femoral   canal was opened with a drill, irrigated to try to prevent fat emboli.  An   intramedullary rod was passed at 3 degrees valgus, 9 mm of bone was   resected off the distal femur.  Following this resection, the tibia was   subluxated anteriorly.  Using the extramedullary guide, 2-3 mm of bone was resected off   the proximal medial tibia.  We confirmed the gap would be   stable medially and laterally with a size 6 spacer block as well as confirmed that the tibial cut was perpendicular in the coronal plane, checking with an alignment rod.      Once this  was done, I sized the femur to be a size 4 in the anterior-   posterior dimension, chose a narrow component based on medial and   lateral dimension.  The size 4 rotation block was then pinned in   position anterior referenced using the C-clamp to set rotation.  The   anterior, posterior, and  chamfer cuts were made without difficulty nor   notching making certain that I was along the anterior cortex to help   with flexion gap stability.      The final box cut was made off the lateral aspect of distal femur.      At this point, the tibia was sized to be a size 4.  The size 4 tray was   then pinned in position through the medial third of the tubercle,   drilled, and keel punched.  Trial reduction was now carried with a 4 femur,  4 tibia, a size 6 then to the 8 mm PS insert, and the 35 anatomic patella botton.  The knee was brought to full extension with good flexion stability with the patella   tracking through the trochlea without application of pressure.  Given   all these findings the trial components removed.  Final components were   opened and cement was mixed.  The knee was irrigated with normal saline solution and pulse lavage.  The synovial lining was   then injected with 30 cc of 0.25% Marcaine with epinephrine, 1 cc of Toradol and 30 cc of NS for a total of 61 cc.     Final implants were then cemented onto cleaned and dried cut surfaces of bone with the knee brought to extension with a size 8 mm PS trial insert.      Once the cement had fully cured, excess cement was removed   throughout the knee.  I confirmed that I was satisfied with the range of   motion and stability, and the final size 8 mm PS AOX insert was chosen.  It was   placed into the knee.      The tourniquet had been let down at 25 minutes.  No significant   hemostasis was required.  The extensor mechanism was then reapproximated  using #1 Vicryl and #1 Stratafix sutures with the knee   in flexion.  The   remaining  wound was closed with 2-0 Vicryl and running 4-0 Monocryl.   The knee was cleaned, dried, dressed sterilely using Dermabond and   Aquacel dressing.  The patient was then   brought to recovery room in stable condition, tolerating the procedure   well.   Please note that Physician Assistant, Lanney Gins, PA-C was present for the entirety of the case, and was utilized for pre-operative positioning, peri-operative retractor management, general facilitation of the procedure and for primary wound closure at the end of the case.              Madlyn Frankel Charlann Boxer, M.D.    05/04/2018 8:30 AM

## 2018-05-04 NOTE — Anesthesia Procedure Notes (Addendum)
Spinal  Patient location during procedure: OR Start time: 05/04/2018 7:10 AM End time: 05/04/2018 7:20 AM Staffing Anesthesiologist: Murvin Natal, MD Performed: anesthesiologist  Preanesthetic Checklist Completed: patient identified, site marked, surgical consent, pre-op evaluation, timeout performed, IV checked, risks and benefits discussed and monitors and equipment checked Spinal Block Patient position: sitting Prep: DuraPrep Patient monitoring: heart rate, continuous pulse ox and blood pressure Approach: left paramedian Location: L4-5 Injection technique: single-shot Needle Needle type: Pencan  Needle gauge: 24 G Needle length: 9 cm Assessment Sensory level: T10 Additional Notes Expiration date of kit checked and confirmed. Patient tolerated procedure well, without complications.

## 2018-05-04 NOTE — Evaluation (Signed)
Physical Therapy Evaluation Patient Details Name: Monique Morse MRN: [id number] DOB: 20-Apr-1940 Today's Date: 05/04/2018   History of Present Illness  Pt s/p L TKR and with hx of R THR and L TKR last year  Clinical Impression  Pt s/p L TKR and presents with decreased L LE strength/ROM and post op pain limiting functional mobility.  Pt should progress well to dc home with family assist.    Follow Up Recommendations Follow surgeon's recommendation for DC plan and follow-up therapies    Equipment Recommendations  None recommended by PT    Recommendations for Other Services       Precautions / Restrictions Precautions Precautions: Fall Restrictions Weight Bearing Restrictions: No Other Position/Activity Restrictions: WBAT      Mobility  Bed Mobility Overal bed mobility: Needs Assistance Bed Mobility: Supine to Sit     Supine to sit: Min guard     General bed mobility comments: cues for sequence  Transfers Overall transfer level: Needs assistance Equipment used: Rolling walker (2 wheeled) Transfers: Sit to/from Stand Sit to Stand: Min assist         General transfer comment: cues for LE management and use of UEs to self assist  Ambulation/Gait Ambulation/Gait assistance: Min assist Gait Distance (Feet): 90 Feet Assistive device: Rolling walker (2 wheeled) Gait Pattern/deviations: Step-to pattern;Decreased step length - right;Decreased step length - left;Shuffle;Trunk flexed Gait velocity: decr   General Gait Details: cues for sequence, posture and position from Rw  Stairs            Wheelchair Mobility    Modified Rankin (Stroke Patients Only)       Balance Overall balance assessment: Mild deficits observed, not formally tested                                           Pertinent Vitals/Pain Pain Assessment: 0-10 Pain Score: 5  Pain Location: L knee Pain Descriptors / Indicators: Aching;Sore Pain Intervention(s): Limited  activity within patient's tolerance;Monitored during session;Premedicated before session;Ice applied    Home Living Family/patient expects to be discharged to:: Private residence Living Arrangements: Spouse/significant other Available Help at Discharge: Family Type of Home: House Home Access: Stairs to enter Entrance Stairs-Rails: Doctor, general practice of Steps: 3 Home Layout: One level Home Equipment: Environmental consultant - 2 wheels;Bedside commode      Prior Function Level of Independence: Independent               Hand Dominance        Extremity/Trunk Assessment        Lower Extremity Assessment Lower Extremity Assessment: LLE deficits/detail    Cervical / Trunk Assessment Cervical / Trunk Assessment: Normal  Communication   Communication: No difficulties  Cognition Arousal/Alertness: Awake/alert Behavior During Therapy: WFL for tasks assessed/performed Overall Cognitive Status: Within Functional Limits for tasks assessed                                        General Comments      Exercises Total Joint Exercises Ankle Circles/Pumps: AROM;15 reps;Both;Supine   Assessment/Plan    PT Assessment Patient needs continued PT services  PT Problem List Decreased strength;Decreased range of motion;Decreased activity tolerance;Decreased mobility;Decreased knowledge of use of DME;Pain       PT Treatment Interventions DME instruction;Gait  training;Stair training;Functional mobility training;Therapeutic activities;Therapeutic exercise;Patient/family education    PT Goals (Current goals can be found in the Care Plan section)  Acute Rehab PT Goals Patient Stated Goal: Regain IND PT Goal Formulation: With patient Time For Goal Achievement: 05/11/18 Potential to Achieve Goals: Good    Frequency 7X/week   Barriers to discharge        Co-evaluation               AM-PAC PT "6 Clicks" Mobility  Outcome Measure Help needed turning from  your back to your side while in a flat bed without using bedrails?: A Little Help needed moving from lying on your back to sitting on the side of a flat bed without using bedrails?: A Little Help needed moving to and from a bed to a chair (including a wheelchair)?: A Little Help needed standing up from a chair using your arms (e.g., wheelchair or bedside chair)?: A Little Help needed to walk in hospital room?: A Little Help needed climbing 3-5 steps with a railing? : A Little 6 Click Score: 18    End of Session Equipment Utilized During Treatment: Gait belt Activity Tolerance: Patient tolerated treatment well Patient left: in chair;with call bell/phone within reach;with chair alarm set;with nursing/sitter in room Nurse Communication: Mobility status PT Visit Diagnosis: Difficulty in walking, not elsewhere classified (R26.2)    Time: 1435-1500 PT Time Calculation (min) (ACUTE ONLY): 25 min   Charges:   PT Evaluation $PT Eval Low Complexity: 1 Low          Mauro Kaufmann PT Acute Rehabilitation Services Pager [phone] Office [phone]   Heydy Montilla 05/04/2018, 3:13 PM

## 2018-05-04 NOTE — Anesthesia Postprocedure Evaluation (Signed)
Anesthesia Post Note  Patient: Monique Morse  Procedure(s) Performed: TOTAL KNEE ARTHROPLASTY (Left Knee)     Patient location during evaluation: PACU Anesthesia Type: Regional and Spinal Level of consciousness: oriented and awake and alert Pain management: pain level controlled Vital Signs Assessment: post-procedure vital signs reviewed and stable Respiratory status: spontaneous breathing, respiratory function stable and patient connected to nasal cannula oxygen Cardiovascular status: blood pressure returned to baseline and stable Postop Assessment: no headache, no backache, no apparent nausea or vomiting and spinal receding Anesthetic complications: no    Last Vitals:  Vitals:   05/04/18 1204 05/04/18 1306  BP: (!) 131/58 126/71  Pulse: 89 87  Resp: 16 14  Temp: 36.4 C 36.6 C  SpO2: 97% 96%    Last Pain:  Vitals:   05/04/18 1459  TempSrc:   PainSc: 5                  Cheryll Keisler P Makyna Niehoff

## 2018-05-04 NOTE — Interval H&P Note (Signed)
History and Physical Interval Note:  05/04/2018 6:36 AM  Monique Morse  has presented today for surgery, with the diagnosis of Left knee osteoarthritis  The various methods of treatment have been discussed with the patient and family. After consideration of risks, benefits and other options for treatment, the patient has consented to  Procedure(s): TOTAL KNEE ARTHROPLASTY (Left) as a surgical intervention .  The patient's history has been reviewed, patient examined, no change in status, stable for surgery.  I have reviewed the patient's chart and labs.  Questions were answered to the patient's satisfaction.     Shelda Pal

## 2018-05-04 NOTE — Transfer of Care (Signed)
Immediate Anesthesia Transfer of Care Note  Patient: Monique Morse  Procedure(s) Performed: TOTAL KNEE ARTHROPLASTY (Left Knee)  Patient Location: PACU  Anesthesia Type:Spinal  Level of Consciousness: awake, alert , oriented and patient cooperative  Airway & Oxygen Therapy: Patient Spontanous Breathing and Patient connected to face mask oxygen  Post-op Assessment: Report given to RN and Post -op Vital signs reviewed and stable  Post vital signs: stable  Last Vitals:  Vitals Value Taken Time  BP    Temp    Pulse 73 05/04/2018  8:59 AM  Resp 14 05/04/2018  8:59 AM  SpO2 100 % 05/04/2018  8:59 AM  Vitals shown include unvalidated device data.  Last Pain: There were no vitals filed for this visit.       Complications: No apparent anesthesia complications

## 2018-05-04 NOTE — Discharge Instructions (Signed)

## 2018-05-04 NOTE — Plan of Care (Signed)

## 2018-05-04 NOTE — Anesthesia Procedure Notes (Signed)
Procedure Name: MAC Date/Time: 05/04/2018 7:04 AM Performed by: Lissa Morales, CRNA Pre-anesthesia Checklist: Patient identified, Emergency Drugs available, Suction available, Patient being monitored and Timeout performed Patient Re-evaluated:Patient Re-evaluated prior to induction Oxygen Delivery Method: Simple face mask Placement Confirmation: positive ETCO2

## 2018-05-04 NOTE — Anesthesia Procedure Notes (Signed)
Anesthesia Regional Block: Adductor canal block   Pre-Anesthetic Checklist: ,, timeout performed, Correct Patient, Correct Site, Correct Laterality, Correct Procedure,, site marked, risks and benefits discussed, Surgical consent,  Pre-op evaluation,  At surgeon's request and post-op pain management  Laterality: Left  Prep: chloraprep       Needles:  Injection technique: Single-shot  Needle Type: Echogenic Stimulator Needle     Needle Length: 9cm  Needle Gauge: 21     Additional Needles:   Procedures:,,,, ultrasound used (permanent image in chart),,,,  Narrative:  Start time: 05/04/2018 6:40 AM End time: 05/04/2018 6:50 AM Injection made incrementally with aspirations every 5 mL.  Performed by: Personally  Anesthesiologist: Leonides Grills, MD  Additional Notes: Functioning IV was confirmed and monitors were applied. A time-out was performed. Hand hygiene and sterile gloves were used. The thigh was placed in a frog-leg position and prepped in a sterile fashion. A 72mm 21ga Arrow echogenic stimulator needle was placed using ultrasound guidance.  Negative aspiration and negative test dose prior to incremental administration of local anesthetic. The patient tolerated the procedure well.

## 2018-05-05 ENCOUNTER — Encounter (HOSPITAL_COMMUNITY): Payer: Self-pay | Admitting: Orthopedic Surgery

## 2018-05-05 DIAGNOSIS — M1712 Unilateral primary osteoarthritis, left knee: Secondary | ICD-10-CM | POA: Diagnosis not present

## 2018-05-05 LAB — BASIC METABOLIC PANEL
Anion gap: 6 (ref 5–15)
BUN: 14 mg/dL (ref 8–23)
CO2: 26 mmol/L (ref 22–32)
Calcium: 8.7 mg/dL — ABNORMAL LOW (ref 8.9–10.3)
Chloride: 108 mmol/L (ref 98–111)
Creatinine, Ser: 0.85 mg/dL (ref 0.44–1.00)
GFR calc Af Amer: >60 mL/min (ref >60)
GFR calc non Af Amer: >60 mL/min (ref >60)
Glucose, Bld: 107 mg/dL — ABNORMAL HIGH (ref 70–99)
Potassium: 4.1 mmol/L (ref 3.5–5.1)
Sodium: 140 mmol/L (ref 135–145)

## 2018-05-05 LAB — CBC
HCT: 32.7 % — ABNORMAL LOW (ref 36.0–46.0)
Hemoglobin: 10 g/dL — ABNORMAL LOW (ref 12.0–15.0)
MCH: 29.2 pg (ref 26.0–34.0)
MCHC: 30.6 g/dL (ref 30.0–36.0)
MCV: 95.3 fL (ref 80.0–100.0)
Platelets: 342 10*3/uL (ref 150–400)
RBC: 3.43 MIL/uL — ABNORMAL LOW (ref 3.87–5.11)
RDW: 12.2 % (ref 11.5–15.5)
WBC: 10 10*3/uL (ref 4.0–10.5)
nRBC: 0 % (ref 0.0–0.2)

## 2018-05-05 MED ORDER — HYDROCODONE-ACETAMINOPHEN 7.5-325 MG PO TABS: 1.0000 | ORAL_TABLET | ORAL | 0 refills | Status: DC | PRN

## 2018-05-05 MED ORDER — METHOCARBAMOL 500 MG PO TABS: 500.0000 mg | ORAL_TABLET | Freq: Four times a day (QID) | ORAL | 0 refills | Status: DC | PRN

## 2018-05-05 MED ORDER — FERROUS SULFATE 325 (65 FE) MG PO TABS: 325.0000 mg | ORAL_TABLET | Freq: Two times a day (BID) | ORAL | 3 refills | Status: DC

## 2018-05-05 MED ORDER — CELECOXIB 200 MG PO CAPS: 200.0000 mg | ORAL_CAPSULE | Freq: Two times a day (BID) | ORAL | 0 refills | Status: DC

## 2018-05-05 MED ORDER — ASPIRIN 81 MG PO CHEW: 81.0000 mg | CHEWABLE_TABLET | Freq: Two times a day (BID) | ORAL | 0 refills | Status: AC

## 2018-05-05 NOTE — Plan of Care (Signed)
Plan of care reviewed and discussed with the patient. 

## 2018-05-05 NOTE — Discharge Summary (Signed)
Physician Discharge Summary   Patient ID: [name] MRN: [id number] DOB/AGE: 1940/05/28 78 y.o.  Admit date: 05/04/2018 Discharge date: 05/05/2018  Primary Diagnosis: Left knee osteoarthritis   Admission Diagnoses:  Past Medical History:  Diagnosis Date  . Arthritis    OA  . Hypertension    Discharge Diagnoses:   Principal Problem:   S/P left TKA  Estimated body mass index is 20.55 kg/m as calculated from the following:   Height as of this encounter:  (1.6 m).   Weight as of this encounter: 52.6 kg.  Procedure:  Procedure(s) (LRB): TOTAL KNEE ARTHROPLASTY (Left)   Consults: None  HPI:  Monique Morse is a 78 y.o. female patient of   mine.  The patient had been seen, evaluated, and treated for months conservatively in the   office with medication, activity modification, and injections.  The patient had   radiographic changes of bone-on-bone arthritis with endplate sclerosis and osteophytes noted.  Based on the radiographic changes and failed conservative measures, the patient   decided to proceed with definitive treatment, total knee replacement.  Risks of infection, DVT, component failure, need for revision surgery, neurovascular injury were reviewed in the office setting.  The postop course was reviewed stressing the efforts to maximize post-operative satisfaction and function.  Consent was obtained for benefit of pain   relief.   Laboratory Data: Admission on 05/04/2018, Discharged on 05/05/2018  Component Date Value Ref Range Status  . ABO/RH(D) 04/27/2018    Final                   Value:O POS Performed at [facility], [address] [place], [place] [address]   . WBC 05/05/2018 10.0  4.0 - 10.5 K/uL Final  . RBC 05/05/2018 3.43* 3.87 - 5.11 MIL/uL Final  . Hemoglobin 05/05/2018 10.0* 12.0 - 15.0 g/dL Final  . HCT 21/30/8657 32.7* 36.0 - 46.0 % Final  . MCV 05/05/2018 95.3  80.0 - 100.0 fL Final  . MCH 05/05/2018 29.2  26.0 -  34.0 pg Final  . MCHC 05/05/2018 30.6  30.0 - 36.0 g/dL Final  . RDW 84/69/6295 12.2  11.5 - 15.5 % Final  . Platelets 05/05/2018 342  150 - 400 K/uL Final  . nRBC 05/05/2018 0.0  0.0 - 0.2 % Final   Performed at [facility], [address], [place], [place] [address]  . Sodium 05/05/2018 140  135 - 145 mmol/L Final  . Potassium 05/05/2018 4.1  3.5 - 5.1 mmol/L Final  . Chloride 05/05/2018 108  98 - 111 mmol/L Final  . CO2 05/05/2018 26  22 - 32 mmol/L Final  . Glucose, Bld 05/05/2018 107* 70 - 99 mg/dL Final  . BUN 24/40/1027 14  8 - 23 mg/dL Final  . Creatinine, Ser 05/05/2018 0.85  0.44 - 1.00 mg/dL Final  . Calcium 25/36/6440 8.7* 8.9 - 10.3 mg/dL Final  . GFR calc non Af Amer 05/05/2018 >60  >60 mL/min Final  . GFR calc Af Amer 05/05/2018 >60  >60 mL/min Final  . Anion gap 05/05/2018 6  5 - 15 Final   Performed at [facility], [address], [place], [place] [address]  Hospital Outpatient Visit on 04/27/2018  Component Date Value Ref Range Status  . WBC 04/27/2018 8.5  4.0 - 10.5 K/uL Final  . RBC 04/27/2018 4.29  3.87 - 5.11 MIL/uL Final  . Hemoglobin 04/27/2018 12.7  12.0 - 15.0 g/dL  Final  . HCT 04/27/2018 40.3  36.0 - 46.0 % Final  . MCV 04/27/2018 93.9  80.0 - 100.0 fL Final  . MCH 04/27/2018 29.6  26.0 - 34.0 pg Final  . MCHC 04/27/2018 31.5  30.0 - 36.0 g/dL Final  . RDW 94/80/1655 12.3  11.5 - 15.5 % Final  . Platelets 04/27/2018 357  150 - 400 K/uL Final  . nRBC 04/27/2018 0.0  0.0 - 0.2 % Final   Performed at [facility], [address], [place], [place] [address]  . Sodium 04/27/2018 138  135 - 145 mmol/L Final  . Potassium 04/27/2018 4.8  3.5 - 5.1 mmol/L Final  . Chloride 04/27/2018 104  98 - 111 mmol/L Final  . CO2 04/27/2018 24  22 - 32 mmol/L Final  . Glucose, Bld 04/27/2018 110* 70 - 99 mg/dL Final  . BUN 70/78/6754 15  8 - 23 mg/dL Final  . Creatinine, Ser 04/27/2018 0.78  0.44 - 1.00 mg/dL Final    . Calcium 49/20/1007 9.1  8.9 - 10.3 mg/dL Final  . GFR calc non Af Amer 04/27/2018 >60  >60 mL/min Final  . GFR calc Af Amer 04/27/2018 >60  >60 mL/min Final  . Anion gap 04/27/2018 10  5 - 15 Final   Performed at [facility], [address], [place], [place] [address]  . ABO/RH(D) 04/27/2018 O POS   Final  . Antibody Screen 04/27/2018 NEG   Final  . Sample Expiration 04/27/2018 05/07/2018   Final  . Extend sample reason 04/27/2018    Final                   Value:NO TRANSFUSIONS OR PREGNANCY IN THE PAST 3 MONTHS Performed at [facility], [address] [place], [place] [address]   . MRSA, PCR 04/27/2018 NEGATIVE  NEGATIVE Final  . Staphylococcus aureus 04/27/2018 NEGATIVE  NEGATIVE Final   Comment: (NOTE) The Xpert SA Assay (FDA approved for NASAL specimens in patients 73 years of age and older), is one component of a comprehensive surveillance program. It is not intended to diagnose infection nor to guide or monitor treatment. Performed at [facility], [address] [place], [place] [address]      X-Rays:No results found.  EKG:No orders found for this or any previous visit.   Hospital Course: Monique Morse is a 78 y.o. who was admitted to [facility]. They were brought to the operating room on 05/04/2018 and underwent Procedure(s): TOTAL KNEE ARTHROPLASTY.  Patient tolerated the procedure well and was later transferred to the recovery room and then to the orthopaedic floor for postoperative care. They were given PO and IV analgesics for pain control following their surgery. They were given 24 hours of postoperative antibiotics of  Anti-infectives (From admission, onward)   Start     Dose/Rate Route Frequency Ordered Stop   05/04/18 1400  ceFAZolin (ANCEF) IVPB 2g/100 mL premix     2 g 200 mL/hr over 30 Minutes Intravenous Every 6 hours 05/04/18 1013 05/04/18 2101   05/04/18 0600  ceFAZolin (ANCEF) IVPB  2g/100 mL premix     2 g 200 mL/hr over 30 Minutes Intravenous On call to O.R. 05/04/18 6415 05/04/18 0708     and started on DVT prophylaxis in the form of Aspirin.   PT and OT were ordered for total joint protocol. Discharge planning consulted to help with postop disposition and equipment needs.  Patient had  a good night on the evening of surgery. They started to get up OOB with therapy on POD #0. Pt was seen during rounds and was ready to go home pending progress with therapy. She worked with therapy on POD #1 and was meeting her goals. Pt was discharged to home later that day in stable condition.  Diet: Regular diet Activity: WBAT Follow-up: in 2 weeks Disposition: Home Discharged Condition: good   Discharge Instructions    Call MD / Call 911   Complete by:  As directed    If you experience chest pain or shortness of breath, CALL 911 and be transported to the hospital emergency room.  If you develope a fever above 101 F, pus (white drainage) or increased drainage or redness at the wound, or calf pain, call your surgeon's office.   Change dressing   Complete by:  As directed    Maintain surgical dressing until follow up in the clinic. If the edges start to pull up, may reinforce with tape. If the dressing is no longer working, may remove and cover with gauze and tape, but must keep the area dry and clean.  Call with any questions or concerns.   Change dressing   Complete by:  As directed    Maintain surgical dressing until follow up in the clinic. If the edges start to pull up, may reinforce with tape. If the dressing is no longer working, may remove and cover with gauze and tape, but must keep the area dry and clean.  Call with any questions or concerns.   Constipation Prevention   Complete by:  As directed    Drink plenty of fluids.  Prune juice may be helpful.  You may use a stool softener, such as Colace (over the counter) 100 mg twice a day.  Use MiraLax (over the counter) for  constipation as needed.   Diet - low sodium heart healthy   Complete by:  As directed    Discharge instructions   Complete by:  As directed    Maintain surgical dressing until follow up in the clinic. If the edges start to pull up, may reinforce with tape. If the dressing is no longer working, may remove and cover with gauze and tape, but must keep the area dry and clean.  [facility]. Call with any questions or concerns.   Increase activity slowly as tolerated   Complete by:  As directed    Weight bearing as tolerated with assist device (walker, cane, etc) as directed, use it as long as suggested by your surgeon or therapist, typically at least 4-6 weeks.   TED hose   Complete by:  As directed    Use stockings (TED hose) for 2 weeks on both leg(s).  You may remove them at night for sleeping.   TED hose   Complete by:  As directed    Use stockings (TED hose) for 2 weeks on both leg(s).  You may remove them at night for sleeping.     Allergies as of 05/05/2018   No Known Allergies     Medication List    STOP taking these medications   aspirin EC 81 MG tablet Replaced by:  aspirin 81 MG chewable tablet     TAKE these medications   aspirin 81 MG chewable tablet Chew 1 tablet (81 mg total) by mouth 2 (two) times daily for 28 days. Then resume normal dose. Replaces:  aspirin EC 81 MG  tablet   CALCIUM 600/VITAMIN D PO Take 1 tablet by mouth daily.   celecoxib 200 MG capsule Commonly known as:  CELEBREX Take 1 capsule (200 mg total) by mouth 2 (two) times daily.   ferrous sulfate 325 (65 FE) MG tablet Take 1 tablet (325 mg total) by mouth 2 (two) times daily with a meal.   HYDROcodone-acetaminophen 7.5-325 MG tablet Commonly known as:  NORCO Take 1-2 tablets by mouth every 4 (four) hours as needed for severe pain (pain score 7-10).   lisinopril 20 MG tablet Commonly known as:  PRINIVIL,ZESTRIL Take 20 mg by mouth daily.   methocarbamol 500  MG tablet Commonly known as:  ROBAXIN Take 1 tablet (500 mg total) by mouth every 6 (six) hours as needed for muscle spasms.            Discharge Care Instructions  (From admission, onward)         Start     Ordered   05/05/18 0000  Change dressing    Comments:  Maintain surgical dressing until follow up in the clinic. If the edges start to pull up, may reinforce with tape. If the dressing is no longer working, may remove and cover with gauze and tape, but must keep the area dry and clean.  Call with any questions or concerns.   05/05/18 0738   05/05/18 0000  Change dressing    Comments:  Maintain surgical dressing until follow up in the clinic. If the edges start to pull up, may reinforce with tape. If the dressing is no longer working, may remove and cover with gauze and tape, but must keep the area dry and clean.  Call with any questions or concerns.   05/05/18 9244         Follow-up Information    Durene Romans, MD. Schedule an appointment as soon as possible for a visit in 2 weeks.   Specialty:  Orthopedic Surgery Contact information: [address] [place] [address] [phone]           Signed: Dennie Bible, PA-C Orthopedic Surgery 05/05/2018, 2:37 PM

## 2018-05-05 NOTE — Care Management Obs Status (Signed)
MEDICARE OBSERVATION STATUS NOTIFICATION   Patient Details  Name: Monique Morse MRN: [id number] Date of Birth: 09/04/1940   Medicare Observation Status Notification Given:  Yes    Golda Acre, RN 05/05/2018, 9:36 AM

## 2018-05-05 NOTE — Progress Notes (Signed)
Physical Therapy Treatment Patient Details Name: Monique Morse MRN: [id number] DOB: Jun 12, 1940 Today's Date: 05/05/2018    History of Present Illness Pt s/p L TKR and with hx of R THR and L TKR last year    PT Comments    Pt motivated and progressing well with mobility.  Pt reviewed stairs, use of ice and home therex program - written instruction provided and reviewed.   Follow Up Recommendations  Follow surgeon's recommendation for DC plan and follow-up therapies     Equipment Recommendations  None recommended by PT    Recommendations for Other Services       Precautions / Restrictions Precautions Precautions: Fall Restrictions Weight Bearing Restrictions: No Other Position/Activity Restrictions: WBAT    Mobility  Bed Mobility Overal bed mobility: Needs Assistance Bed Mobility: Supine to Sit     Supine to sit: Supervision     General bed mobility comments: no physical assist  Transfers Overall transfer level: Needs assistance Equipment used: Rolling walker (2 wheeled) Transfers: Sit to/from Stand Sit to Stand: Supervision         General transfer comment: cues for LE management and use of UEs to self assist  Ambulation/Gait Ambulation/Gait assistance: Min guard;Supervision Gait Distance (Feet): 120 Feet Assistive device: Rolling walker (2 wheeled) Gait Pattern/deviations: Step-to pattern;Decreased step length - right;Decreased step length - left;Shuffle;Trunk flexed Gait velocity: decr   General Gait Details: cues for sequence, posture and position from Rw   Stairs Stairs: Yes Stairs assistance: Min assist Stair Management: One rail Left;Step to pattern;Forwards Number of Stairs: 4 General stair comments: cues for sequence; both hands on one rail   Wheelchair Mobility    Modified Rankin (Stroke Patients Only)       Balance Overall balance assessment: Mild deficits observed, not formally tested                                          Cognition Arousal/Alertness: Awake/alert Behavior During Therapy: WFL for tasks assessed/performed Overall Cognitive Status: Within Functional Limits for tasks assessed                                        Exercises Total Joint Exercises Ankle Circles/Pumps: AROM;15 reps;Both;Supine Quad Sets: AROM;Both;10 reps;Supine Heel Slides: AAROM;Left;10 reps;Supine Straight Leg Raises: AAROM;AROM;Left;10 reps;Supine Long Arc Quad: AAROM;AROM;Left;10 reps;Seated Knee Flexion: AAROM;AROM;Left;10 reps;Seated Goniometric ROM: AAROM L knee -5 - 85    General Comments        Pertinent Vitals/Pain Pain Assessment: 0-10 Pain Score: 5  Pain Location: L knee Pain Descriptors / Indicators: Aching;Sore Pain Intervention(s): Limited activity within patient's tolerance;Monitored during session;Premedicated before session;Ice applied    Home Living                      Prior Function            PT Goals (current goals can now be found in the care plan section) Acute Rehab PT Goals Patient Stated Goal: Regain IND PT Goal Formulation: With patient Time For Goal Achievement: 05/11/18 Potential to Achieve Goals: Good Progress towards PT goals: Progressing toward goals    Frequency    7X/week      PT Plan Current plan remains appropriate    Co-evaluation  AM-PAC PT "6 Clicks" Mobility   Outcome Measure  Help needed turning from your back to your side while in a flat bed without using bedrails?: None Help needed moving from lying on your back to sitting on the side of a flat bed without using bedrails?: None Help needed moving to and from a bed to a chair (including a wheelchair)?: A Little Help needed standing up from a chair using your arms (e.g., wheelchair or bedside chair)?: A Little Help needed to walk in hospital room?: A Little Help needed climbing 3-5 steps with a railing? : A Little 6 Click Score: 20    End  of Session Equipment Utilized During Treatment: Gait belt Activity Tolerance: Patient tolerated treatment well Patient left: in chair;with call bell/phone within reach;with chair alarm set Nurse Communication: Mobility status PT Visit Diagnosis: Difficulty in walking, not elsewhere classified (R26.2)     Time: 6010-9323 PT Time Calculation (min) (ACUTE ONLY): 38 min  Charges:  $Gait Training: 8-22 mins $Therapeutic Exercise: 23-37 mins                     Mauro Kaufmann PT Acute Rehabilitation Services Pager [phone] Office [phone]    Dellene Mcgroarty 05/05/2018, 9:34 AM

## 2018-05-05 NOTE — Progress Notes (Signed)
   Subjective: 1 Day Post-Op Procedure(s) (LRB): TOTAL KNEE ARTHROPLASTY (Left) Patient reports pain as mild.   Patient seen in rounds with Dr. Charlann Boxer. Patient is well, and has had no acute complaints or problems. No acute events overnight. Foley catheter removed, positive flatus.  We will continue therapy today.   Objective: Vital signs in last 24 hours: Temp:  [97.6 F (36.4 C)-98.3 F (36.8 C)] 98.3 F (36.8 C) (03/06 0535) Pulse Rate:  [62-89] 70 (03/06 0535) Resp:  [11-18] 18 (03/06 0535) BP: (104-142)/(58-79) 142/78 (03/06 0535) SpO2:  [96 %-100 %] 98 % (03/06 0535)  Intake/Output from previous day:  Intake/Output Summary (Last 24 hours) at 05/05/2018 0731 Last data filed at 05/05/2018 0600 Gross per 24 hour  Intake 3300.05 ml  Output 2325 ml  Net 975.05 ml     Intake/Output this shift: No intake/output data recorded.  Labs: Recent Labs    05/05/18 0445  HGB 10.0*   Recent Labs    05/05/18 0445  WBC 10.0  RBC 3.43*  HCT 32.7*  PLT 342   Recent Labs    05/05/18 0445  NA 140  K 4.1  CL 108  CO2 26  BUN 14  CREATININE 0.85  GLUCOSE 107*  CALCIUM 8.7*   No results for input(s): LABPT, INR in the last 72 hours.  Exam: General - Patient is Alert and Oriented Extremity - Neurologically intact Sensation intact distally Intact pulses distally Dorsiflexion/Plantar flexion intact Dressing - dressing C/D/I Motor Function - intact, moving foot and toes well on exam.   Past Medical History:  Diagnosis Date  . Arthritis    OA  . Hypertension     Assessment/Plan: 1 Day Post-Op Procedure(s) (LRB): TOTAL KNEE ARTHROPLASTY (Left) Principal Problem:   S/P left TKA  Estimated body mass index is 20.55 kg/m as calculated from the following:   Height as of this encounter: 5\' 3"  (1.6 m).   Weight as of this encounter: 52.6 kg. Advance diet Up with therapy D/C IV fluids   Patient's anticipated LOS is less than 2 midnights, meeting these  requirements: - Lives within 1 hour of care - Has a competent adult at home to recover with post-op recover - NO history of  - Chronic pain requiring opiods  - Diabetes  - Coronary Artery Disease  - Heart failure  - Heart attack  - Stroke  - DVT/VTE  - Cardiac arrhythmia  - Respiratory Failure/COPD  - Renal failure  - Anemia  - Advanced Liver disease   DVT Prophylaxis - Aspirin Weight bearing as tolerated. D/C O2 and pulse ox and try on room air.  Plan is to go Home after hospital stay. Plan for discharge today as long as she continues to meet goals with therapy. Scheduled for outpatient PT. Follow up in the office in 2 weeks.   Dennie Bible, PA-C Orthopedic Surgery 05/05/2018, 7:31 AM

## 2018-09-25 ENCOUNTER — Other Ambulatory Visit: Payer: Self-pay

## 2018-09-25 DIAGNOSIS — Z20822 Contact with and (suspected) exposure to covid-19: Secondary | ICD-10-CM

## 2018-09-27 LAB — NOVEL CORONAVIRUS, NAA: SARS-CoV-2, NAA: NOT DETECTED

## 2019-01-04 ENCOUNTER — Other Ambulatory Visit: Payer: Self-pay | Admitting: Family Medicine

## 2019-01-04 DIAGNOSIS — R5381 Other malaise: Secondary | ICD-10-CM

## 2019-01-10 ENCOUNTER — Other Ambulatory Visit: Payer: Self-pay | Admitting: Family Medicine

## 2019-01-10 DIAGNOSIS — Z1231 Encounter for screening mammogram for malignant neoplasm of breast: Secondary | ICD-10-CM

## 2019-01-10 DIAGNOSIS — E2839 Other primary ovarian failure: Secondary | ICD-10-CM

## 2019-04-02 ENCOUNTER — Other Ambulatory Visit: Payer: Self-pay

## 2019-04-02 ENCOUNTER — Ambulatory Visit
Admission: RE | Admit: 2019-04-02 | Discharge: 2019-04-02 | Disposition: A | Discharge status: Home / Self Care | Payer: Medicare Other | Source: Ambulatory Visit | Attending: Family Medicine | Admitting: Family Medicine

## 2019-04-02 DIAGNOSIS — E2839 Other primary ovarian failure: Secondary | ICD-10-CM

## 2019-04-02 DIAGNOSIS — Z1231 Encounter for screening mammogram for malignant neoplasm of breast: Secondary | ICD-10-CM

## 2020-03-06 ENCOUNTER — Other Ambulatory Visit: Payer: Self-pay | Admitting: Family Medicine

## 2020-03-06 DIAGNOSIS — Z1231 Encounter for screening mammogram for malignant neoplasm of breast: Secondary | ICD-10-CM

## 2020-04-16 ENCOUNTER — Ambulatory Visit: Payer: Medicare Other

## 2020-05-01 ENCOUNTER — Ambulatory Visit: Payer: Medicare Other

## 2020-06-18 ENCOUNTER — Ambulatory Visit
Admission: RE | Admit: 2020-06-18 | Discharge: 2020-06-18 | Disposition: A | Discharge status: Home / Self Care | Payer: Medicare Other | Source: Ambulatory Visit | Attending: Family Medicine | Admitting: Family Medicine

## 2020-06-18 ENCOUNTER — Other Ambulatory Visit: Payer: Self-pay

## 2020-06-18 DIAGNOSIS — Z1231 Encounter for screening mammogram for malignant neoplasm of breast: Secondary | ICD-10-CM

## 2020-06-19 ENCOUNTER — Other Ambulatory Visit: Payer: Self-pay | Admitting: Family Medicine

## 2020-06-19 DIAGNOSIS — R928 Other abnormal and inconclusive findings on diagnostic imaging of breast: Secondary | ICD-10-CM

## 2020-07-11 ENCOUNTER — Other Ambulatory Visit: Payer: Self-pay

## 2020-07-11 ENCOUNTER — Other Ambulatory Visit: Payer: Self-pay | Admitting: Family Medicine

## 2020-07-11 ENCOUNTER — Ambulatory Visit
Admission: RE | Admit: 2020-07-11 | Discharge: 2020-07-11 | Disposition: A | Discharge status: Home / Self Care | Payer: Medicare Other | Source: Ambulatory Visit | Attending: Family Medicine | Admitting: Family Medicine

## 2020-07-11 DIAGNOSIS — R928 Other abnormal and inconclusive findings on diagnostic imaging of breast: Secondary | ICD-10-CM

## 2020-07-11 DIAGNOSIS — N6489 Other specified disorders of breast: Secondary | ICD-10-CM

## 2021-05-12 ENCOUNTER — Other Ambulatory Visit: Payer: Self-pay | Admitting: Family Medicine

## 2021-05-12 DIAGNOSIS — Z1231 Encounter for screening mammogram for malignant neoplasm of breast: Secondary | ICD-10-CM

## 2021-06-19 ENCOUNTER — Ambulatory Visit
Admission: RE | Admit: 2021-06-19 | Discharge: 2021-06-19 | Disposition: A | Discharge status: Home / Self Care | Payer: Medicare Other | Source: Ambulatory Visit | Attending: Family Medicine | Admitting: Family Medicine

## 2021-06-19 ENCOUNTER — Ambulatory Visit: Payer: Medicare Other

## 2021-06-19 DIAGNOSIS — Z1231 Encounter for screening mammogram for malignant neoplasm of breast: Secondary | ICD-10-CM

## 2022-01-28 ENCOUNTER — Other Ambulatory Visit: Payer: Self-pay | Admitting: Family Medicine

## 2022-01-28 DIAGNOSIS — M810 Age-related osteoporosis without current pathological fracture: Secondary | ICD-10-CM

## 2022-06-16 ENCOUNTER — Other Ambulatory Visit: Payer: Self-pay | Admitting: Family Medicine

## 2022-06-16 DIAGNOSIS — Z1231 Encounter for screening mammogram for malignant neoplasm of breast: Secondary | ICD-10-CM

## 2022-06-22 ENCOUNTER — Ambulatory Visit: Payer: Medicare Other

## 2022-06-29 ENCOUNTER — Ambulatory Visit
Admission: RE | Admit: 2022-06-29 | Discharge: 2022-06-29 | Disposition: A | Discharge status: Home / Self Care | Payer: Medicare Other | Source: Ambulatory Visit | Attending: Family Medicine | Admitting: Family Medicine

## 2022-06-29 DIAGNOSIS — Z1231 Encounter for screening mammogram for malignant neoplasm of breast: Secondary | ICD-10-CM

## 2022-07-02 ENCOUNTER — Other Ambulatory Visit: Payer: Self-pay | Admitting: Family Medicine

## 2022-07-02 DIAGNOSIS — R928 Other abnormal and inconclusive findings on diagnostic imaging of breast: Secondary | ICD-10-CM

## 2022-07-10 ENCOUNTER — Ambulatory Visit
Admission: RE | Admit: 2022-07-10 | Discharge: 2022-07-10 | Disposition: A | Discharge status: Home / Self Care | Payer: Medicare Other | Source: Ambulatory Visit | Attending: Family Medicine | Admitting: Family Medicine

## 2022-07-10 DIAGNOSIS — R928 Other abnormal and inconclusive findings on diagnostic imaging of breast: Secondary | ICD-10-CM

## 2022-07-15 ENCOUNTER — Other Ambulatory Visit: Payer: Medicare Other

## 2022-11-30 DIAGNOSIS — C801 Malignant (primary) neoplasm, unspecified: Secondary | ICD-10-CM

## 2022-11-30 HISTORY — DX: Malignant (primary) neoplasm, unspecified: C80.1

## 2022-12-22 ENCOUNTER — Encounter (INDEPENDENT_AMBULATORY_CARE_PROVIDER_SITE_OTHER): Payer: Self-pay | Admitting: Otolaryngology

## 2022-12-24 ENCOUNTER — Ambulatory Visit
Admission: RE | Admit: 2022-12-24 | Discharge: 2022-12-24 | Disposition: A | Discharge status: Home / Self Care | Payer: Medicare Other | Source: Ambulatory Visit | Attending: Family Medicine | Admitting: Family Medicine

## 2022-12-24 DIAGNOSIS — M810 Age-related osteoporosis without current pathological fracture: Secondary | ICD-10-CM

## 2023-01-10 ENCOUNTER — Encounter (INDEPENDENT_AMBULATORY_CARE_PROVIDER_SITE_OTHER): Payer: Self-pay

## 2023-01-10 ENCOUNTER — Ambulatory Visit (INDEPENDENT_AMBULATORY_CARE_PROVIDER_SITE_OTHER): Payer: Medicare Other

## 2023-01-10 VITALS — Ht 60.0 in | Wt 112.0 lb

## 2023-01-10 DIAGNOSIS — H9011 Conductive hearing loss, unilateral, right ear, with unrestricted hearing on the contralateral side: Secondary | ICD-10-CM | POA: Diagnosis not present

## 2023-01-10 DIAGNOSIS — H6121 Impacted cerumen, right ear: Secondary | ICD-10-CM

## 2023-01-12 DIAGNOSIS — H9011 Conductive hearing loss, unilateral, right ear, with unrestricted hearing on the contralateral side: Secondary | ICD-10-CM | POA: Insufficient documentation

## 2023-01-12 DIAGNOSIS — H6121 Impacted cerumen, right ear: Secondary | ICD-10-CM | POA: Insufficient documentation

## 2023-01-12 NOTE — Progress Notes (Signed)
Patient ID: Monique Morse, female   DOB: 06/15/1940, 82 y.o.   MRN: [id number]  Cc: Right ear hearing loss  HPI:  Monique Morse is an 82 y.o. female who presents today complaining of clogging sensation in the right ear and muffled hearing.  The patient has been symptomatic over the past month.  She was recently noted to have right ear cerumen impaction.  Attempts to remove the cerumen impaction was unsuccessful.  Currently she denies any significant otalgia or otorrhea.  She has no previous ENT surgery.  She has never worn hearing aids.  Past Medical History:  Diagnosis Date   Arthritis    OA   Hypertension     Past Surgical History:  Procedure Laterality Date   CARPAL TUNNEL RELEASE Left    ECOPTIC PREGNANCY SURGERY  YRS AGO   X 2   RIGHT HIP REPLACMENT  03/2017   RIGHT KNEE REPLACEMENT  07/2017   TOTAL KNEE ARTHROPLASTY Left 05/04/2018   Procedure: TOTAL KNEE ARTHROPLASTY;  Surgeon: Durene Romans, MD;  Location: [facility];  Service: Orthopedics;  Laterality: Left;    Family History  Problem Relation Age of Onset   Breast cancer Daughter     Social History:  reports that she has quit smoking. Her smoking use included cigarettes. She has never used smokeless tobacco. She reports current alcohol use. She reports that she does not use drugs.  Allergies: No Known Allergies  Prior to Admission medications   Medication Sig Start Date End Date Taking? Authorizing Provider  alendronate (FOSAMAX) 10 MG tablet alendronate 10 mg tablet  TAKE 1 TABLET 30 MINUTES BEFORE 1ST FOOD/BEVERAGE/MEDICINE OF THE DAY WITH PLAIN WATER ONCE A DAY   Yes [provider]  Calcium Carbonate-Vitamin D (CALCIUM 600/VITAMIN D PO) Take 1 tablet by mouth daily.   Yes [provider]  lisinopril (PRINIVIL,ZESTRIL) 20 MG tablet Take 20 mg by mouth daily.   Yes [provider]  celecoxib (CELEBREX) 200 MG capsule Take 1 capsule (200 mg total) by mouth 2 (two) times daily. Patient  not taking: Reported on 01/10/2023 05/05/18   Cassandria Anger, PA-C  ferrous sulfate 325 (65 FE) MG tablet Take 1 tablet (325 mg total) by mouth 2 (two) times daily with a meal. Patient not taking: Reported on 01/10/2023 05/05/18   Cassandria Anger, PA-C  HYDROcodone-acetaminophen (NORCO) 7.5-325 MG tablet Take 1-2 tablets by mouth every 4 (four) hours as needed for severe pain (pain score 7-10). Patient not taking: Reported on 01/10/2023 05/05/18   Cassandria Anger, PA-C  methocarbamol (ROBAXIN) 500 MG tablet Take 1 tablet (500 mg total) by mouth every 6 (six) hours as needed for muscle spasms. Patient not taking: Reported on 01/10/2023 05/05/18   Cassandria Anger, PA-C   Height 5' (1.524 m), weight 112 lb (50.8 kg). Exam: General: Communicates without difficulty, well nourished, no acute distress. Head: Normocephalic, no evidence injury, no tenderness, facial buttresses intact without stepoff. Face/sinus: No tenderness to palpation and percussion. Facial movement is normal and symmetric. Eyes: PERRL, EOMI. No scleral icterus, conjunctivae clear. Neuro: CN II exam reveals vision grossly intact.  No nystagmus at any point of gaze. Ears: Auricles well formed without lesions.  Right ear cerumen impaction.  Nose: External evaluation reveals normal support and skin without lesions.  Dorsum is intact.  Anterior rhinoscopy reveals congested mucosa over anterior aspect of inferior turbinates and intact septum.  No purulence noted. Oral:  Oral cavity and oropharynx are intact, symmetric, without  erythema or edema.  Mucosa is moist without lesions. Neck: Full range of motion without pain.  There is no significant lymphadenopathy.  No masses palpable.  Thyroid bed within normal limits to palpation.  Parotid glands and submandibular glands equal bilaterally without mass.  Trachea is midline. Neuro:  CN 2-12 grossly intact.    Procedure: Right ear cerumen disimpaction Anesthesia: None Description: Under the operating  microscope, the cerumen is carefully removed with a combination of cerumen currette, alligator forceps, and suction catheters.  After the cerumen is removed, the TMs are noted to be normal.  No mass, erythema, or lesions. The patient tolerated the procedure well.    Assessment: 1.  Right ear cerumen impaction.  After the disimpaction procedure, her tympanic membrane's and middle ear spaces are noted to be normal. 2.  Likely transient right ear conductive hearing loss.  The patient reports restoration of her hearing after the disimpaction procedure.  She defers on a hearing test today.  Plan: 1.  Otomicroscopy with right ear cerumen disimpaction. 2.  The physical exam findings are reviewed with the patient. 3.  The patient will return for reevaluation in 6 months, sooner if needed.  Myrian Botello W Thadius Smisek 01/12/2023, 8:31 AM

## 2023-02-10 NOTE — H&P (Signed)
Patient's anticipated LOS is less than 2 midnights, meeting these requirements: - Younger than 35 - Lives within 1 hour of care - Has a competent adult at home to recover with post-op recover - NO history of  - Chronic pain requiring opiods  - Diabetes  - Coronary Artery Disease  - Heart failure  - Heart attack  - Stroke  - DVT/VTE  - Cardiac arrhythmia  - Respiratory Failure/COPD  - Renal failure  - Anemia  - Advanced Liver disease     Monique Morse is an 82 y.o. female.    Chief Complaint:right shoulder pain  HPI: Pt is a 82 y.o. female complaining of right shoulder pain for multiple years. Pain had continually increased since the beginning. X-rays in the clinic show end-stage arthritic changes of the right shoulder. Pt has tried various conservative treatments which have failed to alleviate their symptoms, including injections and therapy. Various options are discussed with the patient. Risks, benefits and expectations were discussed with the patient. Patient understand the risks, benefits and expectations and wishes to proceed with surgery.   PCP:  Deatra James, MD  D/C Plans: Home  PMH: Past Medical History:  Diagnosis Date   Arthritis    OA   Hypertension     PSH: Past Surgical History:  Procedure Laterality Date   CARPAL TUNNEL RELEASE Left    ECOPTIC PREGNANCY SURGERY  YRS AGO   X 2   RIGHT HIP REPLACMENT  03/2017   RIGHT KNEE REPLACEMENT  07/2017   TOTAL KNEE ARTHROPLASTY Left 05/04/2018   Procedure: TOTAL KNEE ARTHROPLASTY;  Surgeon: Durene Romans, MD;  Location: [facility];  Service: Orthopedics;  Laterality: Left;    Social History:  reports that she has quit smoking. Her smoking use included cigarettes. She has never used smokeless tobacco. She reports current alcohol use. She reports that she does not use drugs. BMI: Estimated body mass index is 21.87 kg/m as calculated from the following:   Height as of 01/10/23: 5' (1.524 m).   Weight as of  01/10/23: 50.8 kg.  No results found for: "ALBUMIN" Diabetes: Patient does not have a diagnosis of diabetes.     Smoking Status:      Allergies:  No Known Allergies  Medications: No current facility-administered medications for this encounter.   Current Outpatient Medications  Medication Sig Dispense Refill   alendronate (FOSAMAX) 10 MG tablet alendronate 10 mg tablet  TAKE 1 TABLET 30 MINUTES BEFORE 1ST FOOD/BEVERAGE/MEDICINE OF THE DAY WITH PLAIN WATER ONCE A DAY     Calcium Carbonate-Vitamin D (CALCIUM 600/VITAMIN D PO) Take 1 tablet by mouth daily.     celecoxib (CELEBREX) 200 MG capsule Take 1 capsule (200 mg total) by mouth 2 (two) times daily. (Patient not taking: Reported on 01/10/2023) 60 capsule 0   ferrous sulfate 325 (65 FE) MG tablet Take 1 tablet (325 mg total) by mouth 2 (two) times daily with a meal. (Patient not taking: Reported on 01/10/2023) 28 tablet 3   HYDROcodone-acetaminophen (NORCO) 7.5-325 MG tablet Take 1-2 tablets by mouth every 4 (four) hours as needed for severe pain (pain score 7-10). (Patient not taking: Reported on 01/10/2023) 60 tablet 0   lisinopril (PRINIVIL,ZESTRIL) 20 MG tablet Take 20 mg by mouth daily.     methocarbamol (ROBAXIN) 500 MG tablet Take 1 tablet (500 mg total) by mouth every 6 (six) hours as needed for muscle spasms. (Patient not taking: Reported on 01/10/2023) 40 tablet 0    No results  found for this or any previous visit (from the past 48 hours). No results found.  ROS: Pain with rom of the right upper extremity  Physical Exam: Alert and oriented 82 y.o. female in no acute distress Cranial nerves 2-12 intact Cervical spine: full rom with no tenderness, nv intact distally Chest: active breath sounds bilaterally, no wheeze rhonchi or rales Heart: regular rate and rhythm, no murmur Abd: non tender non distended with active bowel sounds Hip is stable with rom  Right shoulder painful and weak rom Nv intact distally No rashes  or edema distally  Assessment/Plan Assessment: right shoulder cuff arthropathy  Plan:  Patient will undergo a right reverse total shoulder by Dr. Ranell Patrick at [facility] Risks benefits and expectations were discussed with the patient. Patient understand risks, benefits and expectations and wishes to proceed. Preoperative templating of the joint replacement has been completed, documented, and submitted to the Operating Room personnel in order to optimize intra-operative equipment management.   Alphonsa Overall PA-C, MPAS [facility] is now [employer] [address], [place], [place] [address] Phone: [phone] [email or web address] Facebook  [employer]

## 2023-03-05 NOTE — Patient Instructions (Signed)
 SURGICAL WAITING ROOM VISITATION Patients having surgery or a procedure may have no more than 2 support people in the waiting area - these visitors may rotate in the visitor waiting room.   Due to an increase in RSV and influenza rates and associated hospitalizations, children ages 18 and under may not visit patients in [facility] hospitals. If the patient needs to stay at the hospital during part of their recovery, the visitor guidelines for inpatient rooms apply.  PRE-OP VISITATION  Pre-op nurse will coordinate an appropriate time for 1 support person to accompany the patient in pre-op.  This support person may not rotate.  This visitor will be contacted when the time is appropriate for the visitor to come back in the pre-op area.  Please refer to the [facility] website for the visitor guidelines for Inpatients (after your surgery is over and you are in a regular room).  You are not required to quarantine at this time prior to your surgery. However, you must do this: Hand Hygiene often Do NOT share personal items Notify your provider if you are in close contact with someone who has COVID or you develop fever 100.4 or greater, new onset of sneezing, cough, sore throat, shortness of breath or body aches.  If you test positive for Covid or have been in contact with anyone that has tested positive in the last 10 days please notify you surgeon.    Your procedure is scheduled on:  FRIDAY  March 11, 2023  Report to [facility] Main Entrance: [address] entrance where the [employer] is available.   Report to admitting at: 05:15    AM  Call this number if you have any questions or problems the morning of surgery [phone]  Do not eat food after Midnight the night prior to your surgery/procedure.  After Midnight you may have the following liquids until 04:30 AM DAY OF SURGERY  Clear Liquid Diet Water Black Coffee (sugar ok, NO MILK/CREAM OR CREAMERS)  Tea (sugar ok, NO  MILK/CREAM OR CREAMERS) regular and decaf                             Plain Jell-O  with no fruit (NO RED)                                           Fruit ices (not with fruit pulp, NO RED)                                     Popsicles (NO RED)                                                                  Juice: NO CITRUS JUICES: only apple, WHITE grape, WHITE cranberry Sports drinks like Gatorade or Powerade (NO RED)                    The day of surgery:  Drink ONE (1) Pre-Surgery G2 at  04:30 AM the morning of surgery. Drink in  one sitting. Do not sip.  This drink was given to you during your hospital pre-op appointment visit. Nothing else to drink after completing the Pre-Surgery  G2 : No candy, chewing gum or throat lozenges.    FOLLOW ANY ADDITIONAL PRE OP INSTRUCTIONS YOU RECEIVED FROM YOUR SURGEON'S OFFICE!!!   Oral Hygiene is also important to reduce your risk of infection.        Remember - BRUSH YOUR TEETH THE MORNING OF SURGERY WITH YOUR REGULAR TOOTHPASTE  Do NOT smoke after Midnight the night before surgery.  STOP TAKING all Vitamins, Herbs and supplements 1 week before your  surgery.   STOP TAKING Aspirin  5-7 days before your surgery.   Take ONLY these medicines the morning of surgery with A SIP OF WATER:   None                   You may not have any metal on your body including hair pins, jewelry, and body piercing  Do not wear make-up, lotions, powders, perfumes  or deodorant  Do not wear nail polish including gel and S&S, artificial / acrylic nails, or any other type of covering on natural nails including finger and toenails. If you have artificial nails, gel coating, etc., that needs to be removed by a nail salon, Please have this removed prior to surgery. Not doing so may mean that your surgery could be cancelled or delayed if the Surgeon or anesthesia staff feels like they are unable to monitor you safely.   Do not shave 48 hours prior to surgery to avoid  nicks in your skin which may contribute to postoperative infections.   Contacts, Hearing Aids, dentures or bridgework may not be worn into surgery. DENTURES WILL BE REMOVED PRIOR TO SURGERY PLEASE DO NOT APPLY Poly grip OR ADHESIVES!!!  Patients discharged on the day of surgery will not be allowed to drive home.  Someone NEEDS to stay with you for the first 24 hours after anesthesia.  Do not bring your home medications to the hospital. The Pharmacy will dispense medications listed on your medication list to you during your admission in the Hospital.  Special Instructions: Bring a copy of your healthcare power of attorney and living will documents the day of surgery, if you wish to have them scanned into your [facility] Records- EPIC  Please read over the following fact sheets you were given: IF YOU HAVE QUESTIONS ABOUT YOUR PRE-OP INSTRUCTIONS, PLEASE CALL [phone].     Pre-operative 5 CHG Bath Instructions   You can play a key role in reducing the risk of infection after surgery. Your skin needs to be as free of germs as possible. You can reduce the number of germs on your skin by washing with CHG (chlorhexidine  gluconate) soap before surgery. CHG is an antiseptic soap that kills germs and continues to kill germs even after washing.   DO NOT use if you have an allergy to chlorhexidine /CHG or antibacterial soaps. If your skin becomes reddened or irritated, stop using the CHG and notify one of our RNs at [phone]  Please shower with the CHG soap starting 4 days before surgery using the following schedule: START SHOWERS ON   MONDAY  March 07, 2023  Please keep in mind the following:  DO NOT shave, including legs and underarms, starting the day of your first shower.   You may shave your face at any point  before/day of surgery.   Place clean sheets on your bed the day you start using CHG soap. Use a clean washcloth (not used since being washed) for each shower. DO NOT sleep with pets once you start using the CHG.   CHG Shower Instructions:  If you choose to wash your hair and private area, wash first with your normal shampoo/soap.  After you use shampoo/soap, rinse your hair and body thoroughly to remove shampoo/soap residue.  Turn the water OFF and apply about 3 tablespoons (45 ml) of CHG soap to a CLEAN washcloth.  Apply CHG soap ONLY FROM YOUR NECK DOWN TO YOUR TOES (washing for 3-5 minutes)  DO NOT use CHG soap on face, private areas, open wounds, or sores.  Pay special attention to the area where your surgery is being performed.  If you are having back surgery, having someone wash your back for you may be helpful.  Wait 2 minutes after CHG soap is applied, then you may rinse off the CHG soap.  Pat dry with a clean towel  Put on clean clothes/pajamas   If you choose to wear lotion, please use ONLY the CHG-compatible lotions on the back of this paper.     Additional instructions for the day of surgery: DO NOT APPLY any lotions, deodorants, cologne, or perfumes.   Put on clean/comfortable clothes.  Brush your teeth.  Ask your nurse before applying any prescription medications to the skin.      CHG Compatible Lotions   Aveeno Moisturizing lotion  Cetaphil Moisturizing Cream  Cetaphil Moisturizing Lotion  Clairol Herbal Essence Moisturizing Lotion, Dry Skin  Clairol Herbal Essence Moisturizing Lotion, Extra Dry Skin  Clairol Herbal Essence Moisturizing Lotion, Normal Skin  Curel Age Defying Therapeutic Moisturizing Lotion with Alpha Hydroxy  Curel Extreme Care Body Lotion  Curel Soothing Hands Moisturizing Hand Lotion  Curel Therapeutic Moisturizing Cream, Fragrance-Free  Curel Therapeutic Moisturizing Lotion, Fragrance-Free  Curel Therapeutic Moisturizing Lotion, Original  Formula  Eucerin Daily Replenishing Lotion  Eucerin Dry Skin Therapy Plus Alpha Hydroxy Crme  Eucerin Dry Skin Therapy Plus Alpha Hydroxy Lotion  Eucerin Original Crme  Eucerin Original Lotion  Eucerin Plus Crme Eucerin Plus Lotion  Eucerin TriLipid Replenishing Lotion  Keri Anti-Bacterial Hand Lotion  Keri Deep Conditioning Original Lotion Dry Skin Formula Softly Scented  Keri Deep Conditioning Original Lotion, Fragrance Free Sensitive Skin Formula  Keri Lotion Fast Absorbing Fragrance Free Sensitive Skin Formula  Keri Lotion Fast Absorbing Softly Scented Dry Skin Formula  Keri Original Lotion  Keri Skin Renewal Lotion Keri Silky Smooth Lotion  Keri Silky Smooth Sensitive Skin Lotion  Nivea Body Creamy Conditioning Oil  Nivea Body Extra Enriched Lotion  Nivea Body Original Lotion  Nivea Body Sheer Moisturizing Lotion Nivea Crme  Nivea Skin Firming Lotion  NutraDerm 30 Skin Lotion  NutraDerm Skin Lotion  NutraDerm Therapeutic Skin Cream  NutraDerm Therapeutic Skin Lotion  ProShield Protective Hand Cream  Provon moisturizing lotion       Preparing for Total Shoulder Arthroplasty ================================================================= Please follow these instructions carefully, in addition to any other special Bathing information that was explained to you at the Presurgical Appointment:  BENZOYL PEROXIDE 5% GEL: Used to kill bacteria on the skin which could cause an infection at the surgery site.   Please do not use if you  have an allergy to benzoyl peroxide. If your skin becomes reddened/irritated stop using the benzoyl peroxide and inform your Doctor.   Starting two days before surgery, apply as follows:  1. Apply benzoyl peroxide gel in the morning and at night. Apply after taking a shower. If you are not taking a shower, clean entire shoulder front, back, and side, along with the armpit with a clean wet washcloth.  2. Place a quarter-sized dollop of the  gel on your SHOULDER and rub in thoroughly, making sure to cover the front, back, and side of your shoulder, along with the armpit.   2 Days prior to Surgery  [facility]  March 09, 2023 First Application _______ Morning Second Application _______ Night  Day Before Surgery     THURSDAY   March 10, 2023 First Application______ Morning  On the night before surgery, wash your entire body (except hair, face and private areas) with CHG Soap. THEN, rub in the LAST application of the Benzoyl Peroxide Gel on your shoulder.   3. On the Morning of Surgery wash your BODY AGAIN with CHG Soap (except hair, face and private areas)  4. DO NOT USE THE BENZOYL PEROXIDE GEL ON THE DAY OF YOUR SURGERY       FAILURE TO FOLLOW THESE INSTRUCTIONS MAY RESULT IN THE CANCELLATION OF YOUR SURGERY  PATIENT SIGNATURE_________________________________  NURSE SIGNATURE__________________________________  ________________________________________________________________________       Nasario Exon    An incentive spirometer is a tool that can help keep your lungs clear and active. This tool measures how well you are filling your lungs with each breath. Taking long deep breaths may help reverse or decrease the chance of developing breathing (pulmonary) problems (especially infection) following: A long period of time when you are unable to move or be active. BEFORE THE PROCEDURE  If the spirometer includes an indicator to show your best effort, your nurse or respiratory therapist will set it to a desired goal. If possible, sit up straight or lean slightly forward. Try not to slouch. Hold the incentive spirometer in an upright position. INSTRUCTIONS FOR USE  Sit on the edge of your bed if possible, or sit up as far as you can in bed or on a chair. Hold the incentive spirometer in an upright position. Breathe out normally. Place the mouthpiece in your mouth and seal your lips tightly around  it. Breathe in slowly and as deeply as possible, raising the piston or the ball toward the top of the column. Hold your breath for 3-5 seconds or for as long as possible. Allow the piston or ball to fall to the bottom of the column. Remove the mouthpiece from your mouth and breathe out normally. Rest for a few seconds and repeat Steps 1 through 7 at least 10 times every 1-2 hours when you are awake. Take your time and take a few normal breaths between deep breaths. The spirometer may include an indicator to show your best effort. Use the indicator as a goal to work toward during each repetition. After each set of 10 deep breaths, practice coughing to be sure your lungs are clear. If you have an incision (the cut made at the time of surgery), support your incision when coughing by placing a pillow or rolled up towels firmly against it. Once you are able to get out of bed, walk around indoors and cough well. You may stop using the incentive spirometer when instructed by your caregiver.  RISKS AND COMPLICATIONS Take your time  so you do not get dizzy or light-headed. If you are in pain, you may need to take or ask for pain medication before doing incentive spirometry. It is harder to take a deep breath if you are having pain. AFTER USE Rest and breathe slowly and easily. It can be helpful to keep track of a log of your progress. Your caregiver can provide you with a simple table to help with this. If you are using the spirometer at home, follow these instructions: SEEK MEDICAL CARE IF:  You are having difficultly using the spirometer. You have trouble using the spirometer as often as instructed. Your pain medication is not giving enough relief while using the spirometer. You develop fever of 100.5 F (38.1 C) or higher.                                                                                                    SEEK IMMEDIATE MEDICAL CARE IF:  You cough up bloody sputum that had not been  present before. You develop fever of 102 F (38.9 C) or greater. You develop worsening pain at or near the incision site. MAKE SURE YOU:  Understand these instructions. Will watch your condition. Will get help right away if you are not doing well or get worse. Document Released: 06/28/2006 Document Revised: 05/10/2011 Document Reviewed: 08/29/2006 [facility] Patient Information 2014 [place], [place].

## 2023-03-05 NOTE — Progress Notes (Addendum)
 COVID Vaccine received:  []  No [x]  Yes Date of any COVID positive Test in last 90 days: None  PCP - Vyvyan Sun, MD at [facility]  medical clearance scanned to [employer] 01-12-2023.  Cardiologist - none  Chest x-ray -  EKG -  will do at PST  03-07-2023 Stress Test -  ECHO -  Cardiac Cath -   PCR screen: [x]  Ordered & Completed []   No Order but Needs PROFEND     []   N/A for this surgery  Surgery Plan:  [x]  Ambulatory   []  Outpatient in bed  []  Admit Anesthesia:    []  General  []  Spinal  [x]   Choice []   MAC  Pacemaker / ICD device [x]  No []  Yes   Spinal Cord Stimulator:[x]  No []  Yes       History of Sleep Apnea? [x]  No []  Yes   CPAP used?- [x]  No []  Yes    Does the patient monitor blood sugar?   []  N/A   [x]  No []  Yes  Patient has: []  NO Hx DM   [x]  Pre-DM   []  DM1  []   DM2 Last A1c was: 5.6 on  12-15-22     Blood Thinner / Instructions: none Aspirin  Instructions:  ASA 81 mg   Hold 7 days, Stopped on 03-05-23  ERAS Protocol Ordered: []  No  [x]  Yes PRE-SURGERY []  ENSURE  [x]  G2  Patient is to be NPO after: 0430  Dental hx: []  Dentures:  [x]  N/A      []  Bridge or Partial: has permanent bridge, right upper                  []  Loose or Damaged teeth:   Comments: The patient was given Benzoyl peroxide Gel as ordered. Instruction regarding application starting 2 days prior to surgery was given and patient voiced understanding.   Patient was given the 5 CHG shower / bath instructions for  Reverse Shoulder arthroplasty surgery along with 2 bottles of the CHG soap. Patient will start this on: Monday 03-07-2023            All questions were asked and answered, Patient voiced understanding of this process.   Activity level: Patient is able to climb a flight of stairs without difficulty; [x]  No CP  [x]  No SOB   Patient can perform ADLs without assistance.   Anesthesia review: HTN, Pre-DM,   Patient denies shortness of breath, fever, cough and chest pain at PAT appointment.  Patient  verbalized understanding and agreement to the Pre-Surgical Instructions that were given to them at this PAT appointment. Patient was also educated of the need to review these PAT instructions again prior to her surgery.I reviewed the appropriate phone numbers to call if they have any and questions or concerns.

## 2023-03-07 ENCOUNTER — Other Ambulatory Visit: Payer: Self-pay

## 2023-03-07 ENCOUNTER — Encounter (HOSPITAL_COMMUNITY): Payer: Self-pay

## 2023-03-07 ENCOUNTER — Encounter (HOSPITAL_COMMUNITY)
Admission: RE | Admit: 2023-03-07 | Discharge: 2023-03-07 | Disposition: A | Discharge status: Home / Self Care | Payer: Medicare Other | Source: Ambulatory Visit | Attending: Orthopedic Surgery | Admitting: Orthopedic Surgery

## 2023-03-07 VITALS — BP 189/93 | HR 94 | Temp 98.1°F | Resp 16 | Ht 60.0 in | Wt 112.0 lb

## 2023-03-07 DIAGNOSIS — R7303 Prediabetes: Secondary | ICD-10-CM | POA: Diagnosis not present

## 2023-03-07 DIAGNOSIS — I10 Essential (primary) hypertension: Secondary | ICD-10-CM | POA: Insufficient documentation

## 2023-03-07 DIAGNOSIS — Z01818 Encounter for other preprocedural examination: Secondary | ICD-10-CM | POA: Insufficient documentation

## 2023-03-07 HISTORY — DX: Prediabetes: R73.03

## 2023-03-07 HISTORY — DX: Myoneural disorder, unspecified: G70.9

## 2023-03-07 LAB — CBC
HCT: 43.1 % (ref 36.0–46.0)
Hemoglobin: 13.8 g/dL (ref 12.0–15.0)
MCH: 31.1 pg (ref 26.0–34.0)
MCHC: 32 g/dL (ref 30.0–36.0)
MCV: 97.1 fL (ref 80.0–100.0)
Platelets: 204 10*3/uL (ref 150–400)
RBC: 4.44 MIL/uL (ref 3.87–5.11)
RDW: 13.2 % (ref 11.5–15.5)
WBC: 7.2 10*3/uL (ref 4.0–10.5)
nRBC: 0 % (ref 0.0–0.2)

## 2023-03-07 LAB — BASIC METABOLIC PANEL
Anion gap: 9 (ref 5–15)
BUN: 22 mg/dL (ref 8–23)
CO2: 24 mmol/L (ref 22–32)
Calcium: 9.5 mg/dL (ref 8.9–10.3)
Chloride: 105 mmol/L (ref 98–111)
Creatinine, Ser: 0.85 mg/dL (ref 0.44–1.00)
GFR, Estimated: >60 mL/min (ref >60)
Glucose, Bld: 119 mg/dL — ABNORMAL HIGH (ref 70–99)
Potassium: 4.1 mmol/L (ref 3.5–5.1)
Sodium: 138 mmol/L (ref 135–145)

## 2023-03-07 LAB — SURGICAL PCR SCREEN
MRSA, PCR: NEGATIVE
Staphylococcus aureus: NEGATIVE

## 2023-03-10 NOTE — Anesthesia Preprocedure Evaluation (Addendum)
 Anesthesia Evaluation  Patient identified by MRN, date of birth, ID band Patient awake    Reviewed: Allergy & Precautions, NPO status , Patient's Chart, lab work & pertinent test results  Airway Mallampati: II  TM Distance: >3 FB Neck ROM: Full    Dental  (+) Dental Advisory Given   Pulmonary former smoker   breath sounds clear to auscultation       Cardiovascular hypertension, Pt. on medications  Rhythm:Regular Rate:Normal     Neuro/Psych  Neuromuscular disease    GI/Hepatic negative GI ROS, Neg liver ROS,,,  Endo/Other  negative endocrine ROS    Renal/GU negative Renal ROS     Musculoskeletal  (+) Arthritis ,    Abdominal   Peds  Hematology negative hematology ROS (+)   Anesthesia Other Findings   Reproductive/Obstetrics                             Anesthesia Physical Anesthesia Plan  ASA: 2  Anesthesia Plan: General   Post-op Pain Management: Regional block* and Tylenol  PO (pre-op)*   Induction: Intravenous  PONV Risk Score and Plan: 3 and Dexamethasone , Ondansetron  and Treatment may vary due to age or medical condition  Airway Management Planned: Oral ETT  Additional Equipment:   Intra-op Plan:   Post-operative Plan: Extubation in OR  Informed Consent: I have reviewed the patients History and Physical, chart, labs and discussed the procedure including the risks, benefits and alternatives for the proposed anesthesia with the patient or authorized representative who has indicated his/her understanding and acceptance.     Dental advisory given  Plan Discussed with: CRNA  Anesthesia Plan Comments:        Anesthesia Quick Evaluation

## 2023-03-11 ENCOUNTER — Ambulatory Visit (HOSPITAL_COMMUNITY): Payer: Medicare Other

## 2023-03-11 ENCOUNTER — Ambulatory Visit (HOSPITAL_COMMUNITY)
Admission: RE | Admit: 2023-03-11 | Discharge: 2023-03-11 | Disposition: A | Discharge status: Home / Self Care | Payer: Medicare Other | Attending: Orthopedic Surgery | Admitting: Orthopedic Surgery

## 2023-03-11 ENCOUNTER — Encounter (HOSPITAL_COMMUNITY)
Admission: RE | Disposition: A | Discharge status: Home / Self Care | Payer: Self-pay | Source: Home / Self Care | Attending: Orthopedic Surgery

## 2023-03-11 ENCOUNTER — Ambulatory Visit (HOSPITAL_COMMUNITY): Payer: Medicare Other | Admitting: Certified Registered"

## 2023-03-11 ENCOUNTER — Encounter (HOSPITAL_COMMUNITY): Payer: Self-pay | Admitting: Orthopedic Surgery

## 2023-03-11 DIAGNOSIS — G709 Myoneural disorder, unspecified: Secondary | ICD-10-CM | POA: Insufficient documentation

## 2023-03-11 DIAGNOSIS — Z87891 Personal history of nicotine dependence: Secondary | ICD-10-CM | POA: Diagnosis not present

## 2023-03-11 DIAGNOSIS — M25711 Osteophyte, right shoulder: Secondary | ICD-10-CM | POA: Diagnosis not present

## 2023-03-11 DIAGNOSIS — M12811 Other specific arthropathies, not elsewhere classified, right shoulder: Secondary | ICD-10-CM | POA: Diagnosis not present

## 2023-03-11 DIAGNOSIS — I10 Essential (primary) hypertension: Secondary | ICD-10-CM | POA: Diagnosis not present

## 2023-03-11 DIAGNOSIS — M19011 Primary osteoarthritis, right shoulder: Secondary | ICD-10-CM | POA: Diagnosis not present

## 2023-03-11 DIAGNOSIS — Z79899 Other long term (current) drug therapy: Secondary | ICD-10-CM | POA: Diagnosis not present

## 2023-03-11 DIAGNOSIS — M75101 Unspecified rotator cuff tear or rupture of right shoulder, not specified as traumatic: Secondary | ICD-10-CM | POA: Insufficient documentation

## 2023-03-11 HISTORY — PX: REVERSE SHOULDER ARTHROPLASTY: SHX5054

## 2023-03-11 SURGERY — ARTHROPLASTY, SHOULDER, TOTAL, REVERSE
Anesthesia: General | Site: Shoulder | Laterality: Right

## 2023-03-11 MED ORDER — TRANEXAMIC ACID-NACL 1000-0.7 MG/100ML-% IV SOLN
1000.0000 mg | INTRAVENOUS | Status: AC
  Administered 2023-03-11: 1000 mg via INTRAVENOUS
  Filled 2023-03-11: qty 100

## 2023-03-11 MED ORDER — BUPIVACAINE LIPOSOME 1.3 % IJ SUSP
INTRAMUSCULAR | Status: DC | PRN
  Administered 2023-03-11: 10 mL via PERINEURAL

## 2023-03-11 MED ORDER — PROPOFOL 10 MG/ML IV BOLUS
INTRAVENOUS | Status: AC
  Filled 2023-03-11: qty 20

## 2023-03-11 MED ORDER — PROPOFOL 10 MG/ML IV BOLUS
INTRAVENOUS | Status: DC | PRN
  Administered 2023-03-11: 80 mg via INTRAVENOUS
  Administered 2023-03-11: 50 mg via INTRAVENOUS

## 2023-03-11 MED ORDER — AMISULPRIDE (ANTIEMETIC) 5 MG/2ML IV SOLN: 10.0000 mg | Freq: Once | INTRAVENOUS | Status: DC | PRN

## 2023-03-11 MED ORDER — FENTANYL CITRATE (PF) 100 MCG/2ML IJ SOLN
INTRAMUSCULAR | Status: AC
  Filled 2023-03-11: qty 2

## 2023-03-11 MED ORDER — DEXAMETHASONE SODIUM PHOSPHATE 10 MG/ML IJ SOLN
INTRAMUSCULAR | Status: AC
  Filled 2023-03-11: qty 1

## 2023-03-11 MED ORDER — PHENYLEPHRINE HCL-NACL 20-0.9 MG/250ML-% IV SOLN
INTRAVENOUS | Status: DC | PRN
  Administered 2023-03-11: 50 ug/min via INTRAVENOUS

## 2023-03-11 MED ORDER — 0.9 % SODIUM CHLORIDE (POUR BTL) OPTIME
TOPICAL | Status: DC | PRN
  Administered 2023-03-11: 1000 mL

## 2023-03-11 MED ORDER — ORAL CARE MOUTH RINSE: 15.0000 mL | Freq: Once | OROMUCOSAL | Status: AC

## 2023-03-11 MED ORDER — SUGAMMADEX SODIUM 200 MG/2ML IV SOLN
INTRAVENOUS | Status: DC | PRN
  Administered 2023-03-11: 100 mg via INTRAVENOUS

## 2023-03-11 MED ORDER — LIDOCAINE 2% (20 MG/ML) 5 ML SYRINGE
INTRAMUSCULAR | Status: DC | PRN
  Administered 2023-03-11: 20 mg via INTRAVENOUS

## 2023-03-11 MED ORDER — LACTATED RINGERS IV SOLN: INTRAVENOUS | Status: DC

## 2023-03-11 MED ORDER — BUPIVACAINE-EPINEPHRINE (PF) 0.25% -1:200000 IJ SOLN
INTRAMUSCULAR | Status: DC | PRN
  Administered 2023-03-11: 12 mL

## 2023-03-11 MED ORDER — CEFAZOLIN SODIUM-DEXTROSE 2-4 GM/100ML-% IV SOLN
2.0000 g | INTRAVENOUS | Status: AC
  Administered 2023-03-11: 2 g via INTRAVENOUS
  Filled 2023-03-11: qty 100

## 2023-03-11 MED ORDER — BUPIVACAINE-EPINEPHRINE 0.25% -1:200000 IJ SOLN
INTRAMUSCULAR | Status: AC
  Filled 2023-03-11: qty 1

## 2023-03-11 MED ORDER — ROCURONIUM BROMIDE 10 MG/ML (PF) SYRINGE
PREFILLED_SYRINGE | INTRAVENOUS | Status: AC
  Filled 2023-03-11: qty 10

## 2023-03-11 MED ORDER — FENTANYL CITRATE PF 50 MCG/ML IJ SOSY: 25.0000 ug | PREFILLED_SYRINGE | INTRAMUSCULAR | Status: DC | PRN

## 2023-03-11 MED ORDER — BUPIVACAINE-EPINEPHRINE (PF) 0.25% -1:200000 IJ SOLN
INTRAMUSCULAR | Status: DC | PRN
  Administered 2023-03-11: 15 mL

## 2023-03-11 MED ORDER — ACETAMINOPHEN 325 MG PO TABS
650.0000 mg | ORAL_TABLET | Freq: Once | ORAL | Status: AC
  Administered 2023-03-11: 650 mg via ORAL
  Filled 2023-03-11: qty 2

## 2023-03-11 MED ORDER — ONDANSETRON HCL 4 MG/2ML IJ SOLN
INTRAMUSCULAR | Status: AC
Start: 2023-03-11 — End: ?
  Filled 2023-03-11: qty 2

## 2023-03-11 MED ORDER — TRAMADOL HCL 50 MG PO TABS
50.0000 mg | ORAL_TABLET | Freq: Four times a day (QID) | ORAL | 0 refills | Status: AC | PRN
Start: 2023-03-11 — End: 2024-03-10

## 2023-03-11 MED ORDER — FENTANYL CITRATE (PF) 100 MCG/2ML IJ SOLN
INTRAMUSCULAR | Status: DC | PRN
  Administered 2023-03-11 (×2): 50 ug via INTRAVENOUS

## 2023-03-11 MED ORDER — LIDOCAINE HCL (PF) 2 % IJ SOLN
INTRAMUSCULAR | Status: AC
  Filled 2023-03-11: qty 5

## 2023-03-11 MED ORDER — DEXAMETHASONE SODIUM PHOSPHATE 10 MG/ML IJ SOLN
INTRAMUSCULAR | Status: DC | PRN
  Administered 2023-03-11: 4 mg via INTRAVENOUS

## 2023-03-11 MED ORDER — CHLORHEXIDINE GLUCONATE 0.12 % MT SOLN
15.0000 mL | Freq: Once | OROMUCOSAL | Status: AC
  Administered 2023-03-11: 15 mL via OROMUCOSAL

## 2023-03-11 MED ORDER — ROCURONIUM BROMIDE 10 MG/ML (PF) SYRINGE
PREFILLED_SYRINGE | INTRAVENOUS | Status: DC | PRN
  Administered 2023-03-11: 30 mg via INTRAVENOUS

## 2023-03-11 SURGICAL SUPPLY — 64 items
BAG COUNTER SPONGE SURGICOUNT (BAG) IMPLANT
BAG ZIPLOCK 12X15 (MISCELLANEOUS) IMPLANT
BIT DRILL 1.6MX128 (BIT) IMPLANT
BIT DRILL 170X2.5X (BIT) IMPLANT
BIT DRL 170X2.5X (BIT) ×1
BLADE SAG 18X100X1.27 (BLADE) ×1 IMPLANT
COVER BACK TABLE 60X90IN (DRAPES) ×1 IMPLANT
COVER SURGICAL LIGHT HANDLE (MISCELLANEOUS) ×1 IMPLANT
DRAPE INCISE IOBAN 66X45 STRL (DRAPES) ×1 IMPLANT
DRAPE SHEET LG 3/4 BI-LAMINATE (DRAPES) ×1 IMPLANT
DRAPE SURG ORHT 6 SPLT 77X108 (DRAPES) ×2 IMPLANT
DRAPE TOP 10253 STERILE (DRAPES) ×1 IMPLANT
DRAPE U-SHAPE 47X51 STRL (DRAPES) ×1 IMPLANT
DRSG ADAPTIC 3X8 NADH LF (GAUZE/BANDAGES/DRESSINGS) ×1 IMPLANT
DURAPREP 26ML APPLICATOR (WOUND CARE) ×1 IMPLANT
ECCENTRIC EPIPHYSI MODULAR SZ1 (Trauma) IMPLANT
ELECT BLADE TIP CTD 4 INCH (ELECTRODE) ×1 IMPLANT
ELECT NDL TIP 2.8 STRL (NEEDLE) ×1 IMPLANT
ELECT NEEDLE TIP 2.8 STRL (NEEDLE)
ELECT REM PT RETURN 15FT ADLT (MISCELLANEOUS) ×1 IMPLANT
FACESHIELD WRAPAROUND (MASK) ×1
FACESHIELD WRAPAROUND OR TEAM (MASK) ×1 IMPLANT
GAUZE PAD ABD 8X10 STRL (GAUZE/BANDAGES/DRESSINGS) ×1 IMPLANT
GAUZE SPONGE 4X4 12PLY STRL (GAUZE/BANDAGES/DRESSINGS) ×1 IMPLANT
GLENOSPHERE DELTA XTEND LAT 38 (Miscellaneous) IMPLANT
GLOVE BIOGEL PI IND STRL 7.5 (GLOVE) ×1 IMPLANT
GLOVE BIOGEL PI IND STRL 8.5 (GLOVE) ×1 IMPLANT
GLOVE ORTHO TXT STRL SZ7.5 (GLOVE) ×1 IMPLANT
GLOVE SURG ORTHO 8.5 STRL (GLOVE) ×1 IMPLANT
GOWN STRL REUS W/ TWL XL LVL3 (GOWN DISPOSABLE) ×2 IMPLANT
KIT BASIN OR (CUSTOM PROCEDURE TRAY) ×1 IMPLANT
KIT TURNOVER KIT A (KITS) IMPLANT
MANIFOLD NEPTUNE II (INSTRUMENTS) ×1 IMPLANT
METAGLENE DELTA EXTEND (Trauma) IMPLANT
METAGLENE DXTEND (Trauma) ×1 IMPLANT
MODULAR ECCENTRIC EPIPHYSI SZ1 (Trauma) ×1 IMPLANT
NDL MAYO CATGUT SZ4 TPR NDL (NEEDLE) IMPLANT
NEEDLE MAYO CATGUT SZ4 (NEEDLE) ×1
NS IRRIG 1000ML POUR BTL (IV SOLUTION) ×1 IMPLANT
PACK SHOULDER (CUSTOM PROCEDURE TRAY) ×1 IMPLANT
PIN GUIDE 1.2 (PIN) IMPLANT
PIN GUIDE GLENOPHERE 1.5MX300M (PIN) IMPLANT
PIN METAGLENE 2.5 (PIN) IMPLANT
RESTRAINT HEAD UNIVERSAL NS (MISCELLANEOUS) ×1 IMPLANT
SCREW LOCK 42 (Screw) IMPLANT
SCREW LOCK DELTA XTEND 4.5X30 (Screw) IMPLANT
SLING ARM FOAM STRAP LRG (SOFTGOODS) IMPLANT
SLING ARM IMMOBILIZER MED (SOFTGOODS) IMPLANT
SPACER 38 PLUS 3 (Spacer) IMPLANT
SPIKE FLUID TRANSFER (MISCELLANEOUS) ×1 IMPLANT
SPONGE T-LAP 4X18 ~~LOC~~+RFID (SPONGE) IMPLANT
STEM HUMERAL SZ8 STANDARD (Stem) ×1 IMPLANT
STEM HUMERAL SZ8 STD (Stem) IMPLANT
STRIP CLOSURE SKIN 1/2X4 (GAUZE/BANDAGES/DRESSINGS) ×1 IMPLANT
SUT FIBERWIRE #2 38 T-5 BLUE (SUTURE) ×4
SUT MNCRL AB 4-0 PS2 18 (SUTURE) ×1 IMPLANT
SUT VIC AB 0 CT1 36 (SUTURE) ×1 IMPLANT
SUT VIC AB 0 CT2 27 (SUTURE) ×1 IMPLANT
SUT VIC AB 2-0 CT1 TAPERPNT 27 (SUTURE) ×1 IMPLANT
SUTURE FIBERWR #2 38 T-5 BLUE (SUTURE) ×1 IMPLANT
TAPE CLOTH SURG 4X10 WHT LF (GAUZE/BANDAGES/DRESSINGS) IMPLANT
TOWEL GREEN STERILE FF (TOWEL DISPOSABLE) ×1 IMPLANT
TOWEL OR 17X26 10 PK STRL BLUE (TOWEL DISPOSABLE) ×1 IMPLANT
WATER STERILE IRR 1000ML POUR (IV SOLUTION) IMPLANT

## 2023-03-11 NOTE — Evaluation (Signed)
 Occupational Therapy Evaluation Patient Details Name: Monique Morse MRN: [id number] DOB: 07-01-1940 Today's Date: 03/11/2023   History of Present Illness Pt is a 83 y.o. female s/p R reverse shoulder arthroplasty w/ subscap repair on 03/11/23   Clinical Impression   Pt is s/p above procedure on dominant RUE. Therapist provided education and instruction to patient and spouse in regards to exercises, precautions, positioning, donning upper extremity clothing and bathing while maintaining shoulder precautions, ice and edema management, & donning/doffing sling. Patient and spouse verbalized understanding and demonstrated as needed. Patient needed min assistance to donn shirt, underwear, pants, socks and shoes. Povided with instruction on compensatory strategies to perform ADLs. Pt and spouse with good return demo of taught education. Pt instructed to not attend gym classes at this time, and educated on increase risk of falls with dominant RUE immobilized. Patient to follow up with MD for further therapy needs.         If plan is discharge home, recommend the following: A little help with walking and/or transfers;A little help with bathing/dressing/bathroom;Assist for transportation    Functional Status Assessment  Patient has had a recent decline in their functional status and demonstrates the ability to make significant improvements in function in a reasonable and predictable amount of time.  Equipment Recommendations  None recommended by OT       Precautions / Restrictions Precautions Precautions: Shoulder Type of Shoulder Precautions: NWB, Shoulder Interventions: Off for dressing/bathing/exercises;Shoulder sling/immobilizer Precaution Booklet Issued:  (handout provided) Precaution Comments: Sling at all times except ADL/exercise, R UE NWB, AROM elbow, wrist and hand to tolerance, ok for gentle hand to face ADLs Required Braces or Orthoses: Sling Restrictions Weight Bearing  Restrictions Per Provider Order: Yes RUE Weight Bearing Per Provider Order: Non weight bearing      Mobility Bed Mobility                    Transfers Overall transfer level: Needs assistance Equipment used: None Transfers: Sit to/from Stand Sit to Stand: Contact guard assist                      ADL either performed or assessed with clinical judgement       Pertinent Vitals/Pain Pain Assessment Pain Assessment: No/denies pain     Extremity/Trunk Assessment Upper Extremity Assessment Upper Extremity Assessment: RUE deficits/detail;Right hand dominant RUE: Unable to fully assess due to immobilization       Cervical / Trunk Assessment Cervical / Trunk Assessment: Normal   Communication Communication Communication: No apparent difficulties   Cognition Arousal: Alert Behavior During Therapy: WFL for tasks assessed/performed Overall Cognitive Status: Within Functional Limits for tasks assessed                                       General Comments  Dressing intact, dry    Exercises Exercises: Shoulder   Shoulder Instructions Shoulder Instructions Donning/doffing shirt without moving shoulder: Minimal assistance (cues to avoid AROM of R shoulder during doffing shirt, spouse educated on cue and to watch for shoulder elevation) Method for sponge bathing under operated UE: Minimal assistance (discussed changing washcloths, deodorant application) Donning/doffing sling/immobilizer: Minimal assistance (discussed proper use of sling with handout provided) Correct positioning of sling/immobilizer: Minimal assistance ROM for elbow, wrist and digits of operated UE: Set-up (pt return demonstrating with LUE after education) Sling wearing schedule (on at all times/off  for ADL's): Minimal assistance (able to verbalize end of session) Proper positioning of operated UE when showering: Minimal assistance Positioning of UE while sleeping: Minimal  assistance (discussed use of pillows)    Home Living Family/patient expects to be discharged to:: Private residence Living Arrangements: Spouse/significant other Available Help at Discharge: Family;Available 24 hours/day                                    Prior Functioning/Environment Prior Level of Function : Independent/Modified Independent;Driving             Mobility Comments: independent, attends gym 6-7 days weekly ADLs Comments: indepdent, no falls        OT Problem List: Impaired UE functional use;Impaired sensation;Decreased knowledge of use of DME or AE;Decreased knowledge of precautions       AM-PAC OT 6 Clicks Daily Activity     Outcome Measure Help from another person eating meals?: None Help from another person taking care of personal grooming?: None Help from another person toileting, which includes using toliet, bedpan, or urinal?: A Little Help from another person bathing (including washing, rinsing, drying)?: A Little Help from another person to put on and taking off regular upper body clothing?: A Little Help from another person to put on and taking off regular lower body clothing?: A Little 6 Click Score: 20   End of Session Equipment Utilized During Treatment: Other (comment) (Shoulder sling) Nurse Communication: Mobility status;Other (comment) (shoulder and ADL education complete)  Activity Tolerance: Patient tolerated treatment well Patient left: in chair;with family/visitor present  OT Visit Diagnosis: Other abnormalities of gait and mobility (R26.89)                Time: 8962-8893 OT Time Calculation (min): 29 min Charges:  OT General Charges $OT Visit: 1 Visit OT Evaluation $OT Eval Low Complexity: 1 Low OT Treatments $Self Care/Home Management : 8-22 mins Zaylin Runco L. Tateanna Bach, OTR/L  03/11/23, 11:36 AM

## 2023-03-11 NOTE — Care Plan (Signed)
 Ortho Bundle Case Management Note  Patient Details  Name: CAMREE WIGINGTON MRN: [id number] Date of Birth: Mar 27, 1940                   R Rev TSA on 03/11/23.  DCP: Home with husband.  DME: No needs.  PT: [facility]   DME Arranged:  N/A DME Agency:       Additional Comments: Please contact me with any questions of if this plan should need to change.    Lyle Pepper, CCM Case Manager, [place] [phone] 03/11/2023, 8:58 AM

## 2023-03-11 NOTE — Anesthesia Procedure Notes (Signed)
 Procedure Name: Intubation Date/Time: 03/11/2023 7:36 AM  Performed by: Metta Andrea NOVAK, CRNAPre-anesthesia Checklist: Patient identified, Emergency Drugs available, Suction available, Patient being monitored and Timeout performed Patient Re-evaluated:Patient Re-evaluated prior to induction Oxygen Delivery Method: Circle system utilized Preoxygenation: Pre-oxygenation with 100% oxygen Induction Type: IV induction Ventilation: Mask ventilation without difficulty Laryngoscope Size: Mac and 4 Grade View: Grade I Tube type: Oral Tube size: 7.0 mm Number of attempts: 1 Airway Equipment and Method: Stylet Placement Confirmation: ETT inserted through vocal cords under direct vision, positive ETCO2 and breath sounds checked- equal and bilateral Secured at: 22 cm Tube secured with: Tape Dental Injury: Teeth and Oropharynx as per pre-operative assessment

## 2023-03-11 NOTE — Interval H&P Note (Signed)
 History and Physical Interval Note:  03/11/2023 7:19 AM  Monique Morse  has presented today for surgery, with the diagnosis of Right shoulder rotator cuff arthropathy.  The various methods of treatment have been discussed with the patient and family. After consideration of risks, benefits and other options for treatment, the patient has consented to  Procedure(s) with comments: REVERSE SHOULDER ARTHROPLASTY (Right) - interscalene block as a surgical intervention.  The patient's history has been reviewed, patient examined, no change in status, stable for surgery.  I have reviewed the patient's chart and labs.  Questions were answered to the patient's satisfaction.     Elspeth JONELLE Her

## 2023-03-11 NOTE — Anesthesia Postprocedure Evaluation (Signed)
 Anesthesia Post Note  Patient: Monique Morse  Procedure(s) Performed: REVERSE SHOULDER ARTHROPLASTY (Right: Shoulder)     Patient location during evaluation: PACU Anesthesia Type: General Level of consciousness: awake and alert Pain management: pain level controlled Vital Signs Assessment: post-procedure vital signs reviewed and stable Respiratory status: spontaneous breathing, nonlabored ventilation, respiratory function stable and patient connected to nasal cannula oxygen Cardiovascular status: blood pressure returned to baseline and stable Postop Assessment: no apparent nausea or vomiting Anesthetic complications: no  No notable events documented.  Last Vitals:  Vitals:   03/11/23 1030 03/11/23 1113  BP: 130/68 (!) 147/75  Pulse: 76 83  Resp:  18  Temp:  36.5 C  SpO2: 93% 93%    Last Pain:  Vitals:   03/11/23 1113  TempSrc:   PainSc: 0-No pain                 Epifanio Lamar BRAVO

## 2023-03-11 NOTE — Brief Op Note (Signed)
 03/11/2023  9:15 AM  PATIENT:  Monique Morse  83 y.o. female  PRE-OPERATIVE DIAGNOSIS:  Right shoulder rotator cuff arthropathy  POST-OPERATIVE DIAGNOSIS:  Right shoulder rotator cuff arthropathy  PROCEDURE:  Procedure(s) with comments: REVERSE SHOULDER ARTHROPLASTY (Right) - interscalene block DePuy Delta Xtend WITH subscap repair  SURGEON:  Surgeons and Role:    DEWAINE Kay Kemps, MD - Primary  PHYSICIAN ASSISTANT:   ASSISTANTS: Debby KATHEE Fireman, PA-C   ANESTHESIA:   regional and general  EBL:  50 mL   BLOOD ADMINISTERED:none  DRAINS: none   LOCAL MEDICATIONS USED:  MARCAINE      SPECIMEN:  No Specimen  DISPOSITION OF SPECIMEN:  N/A  COUNTS:  YES  TOURNIQUET:  * No tourniquets in log *  DICTATION: .Other Dictation: Dictation Number [id number]  PLAN OF CARE: Discharge to home after PACU  PATIENT DISPOSITION:  PACU - hemodynamically stable.   Delay start of Pharmacological VTE agent (>24hrs) due to surgical blood loss or risk of bleeding: not applicable

## 2023-03-11 NOTE — Op Note (Signed)
 NAME: Monique Morse, Monique Morse MEDICAL RECORD NO: [id number] ACCOUNT NO: [id number] DATE OF BIRTH: June 22, 1940 FACILITY: [facility] LOCATION: [facility] PHYSICIAN: Elspeth SAUNDERS. Kay, MD  Operative Report   DATE OF PROCEDURE: 03/11/2023  PREOPERATIVE DIAGNOSIS:  Right shoulder rotator cuff tear arthropathy.  POSTOPERATIVE DIAGNOSIS:  Right shoulder rotator cuff tear arthropathy.  PROCEDURE PERFORMED:  Right reverse total shoulder arthroplasty using DePuy Delta Xtend prosthesis with subscapularis repair.  ATTENDING SURGEON:  Elspeth SAUNDERS. Kay, MD  ASSISTANT:  Debby Crock [place], [place], who was scrubbed during the entire procedure, and necessary for satisfactory completion of surgery.  ANESTHESIA:  General anesthesia was used plus interscalene block.  ESTIMATED BLOOD LOSS:  Less than 100 mL.  FLUID REPLACEMENT:  1000 mL crystalloid.  COUNTS:  Instrument count was correct.  COMPLICATIONS:  There were no complications.  ANTIBIOTICS:  Perioperative antibiotics were given.  INDICATIONS:  The patient is an 83 year old female with progressively worsening right shoulder pain and function secondary to rotator cuff tear arthropathy.  The patient has failed an extended period of conservative management and desires operative  treatment to eliminate pain and restore function.  Informed consent was obtained.  DESCRIPTION OF PROCEDURE:  After an adequate level of anesthesia was achieved, the patient was positioned in a modified beach chair position.  The right shoulder was correctly identified and sterile prep and drape were performed.  Timeout called  verifying correct patient and correct site.  We entered the patient's shoulder using a standard deltopectoral incision, starting at the coracoid process and extending down to the anterior humerus.  Dissection down through subcutaneous tissues using  Bovie.  The cephalic vein was identified and taken laterally with the deltoid.  Pectoralis was taken medially.   Conjoined tendon was identified and retracted medially.  Deep retractor was placed.  The biceps tenodesed in situ with 0 Vicryl  figure-of-eight suture x2 incorporating part of the pec tendon.  We then released the subscapularis subperiosteally off the lesser tuberosity and placed a modified Mason-Allen suture technique with #2 FiberWire suture for repair.  We then released the  inferior capsule extending the shoulder and delivering the humeral head out of the wound.  Advanced arthritis was noted.  We entered the proximal humerus with a 6-mm reamer.  We reamed up to a size 8.  We then took our 8-mm T-handle guide and resected  the head at 20 degrees of retroversion with the oscillating saw.  We removed excess osteophytes with a rongeur.  We then subluxed the humerus posteriorly, gaining good exposure of the glenoid.  We removed the capsule and the labrum, the biceps anchor,  and stump.  We then placed our deep retractors.  We removed the remaining cartilage from the glenoid.  We then placed our guide pin centered load for the metaglene preparation.  We then reamed to the subchondral bone with the Metaglene reamer.  Next, we  did our peripheral hand reamer with a T-handle and then we drilled out our central peg hole on power.  We irrigated thoroughly.  We then placed our HA-coated press-fit baseplate in proper position.  We impacted that.  We then placed a 42 screw  inferiorly, a 30 screw superiorly, locking both the screws.  Due to the small AP diameter of the patient's glenoid, we could not place the anterior-posterior screws.  We selected the real 38+0 standard glenosphere and attached that to the baseplate with  a screwdriver.  I did a finger sweep to make sure we had no soft tissue caught  up between the baseplate and the glenosphere.  Next, we went back to the humerus and we reamed for the one right metaphysis.  We then went ahead and trialled with the 8 stem  and the one right metaphysis set in the 0  setting and we impacted that in 20 degrees of retroversion.  We reduced with a 38+3 trial.  We were happy with our soft tissue balancing and stability.  We removed all trial components from the humeral side.  We  irrigated thoroughly.  I drilled holes in the lesser tuberosity and placed #2 FiberWire suture for repair of the subscapularis.  We then irrigated again and used available bone graft from the humeral head and with impaction grafting technique, impacted  the press-fit [place] 8 stem with the 1 right HA-coated metaphysis set in the 0 setting and impacted in 20 degrees of retroversion.  With the stem stable, we selected the real 38+3 polyethylene, placed on the humeral tray, impacted that, reduced the  shoulder with nice little pop as it reduced, appropriate soft tissue tension and stability throughout a full arc of motion.  We irrigated thoroughly and then repaired the subscapularis anatomically back to the lesser tuberosity.  This did not restrict  range of motion and I think will enhance strength and stability.  We irrigated again and then closed the deltopectoral interval with 0 Vicryl suture followed by 2-0 Vicryl for subcutaneous closure and 4-0 Monocryl for skin.  Steri-Strips were applied  followed by a sterile dressing.  The patient tolerated the surgery well.   PUS D: 03/11/2023 9:20:12 am T: 03/11/2023 9:49:00 am  JOB: [id number]/ [id number]

## 2023-03-11 NOTE — Anesthesia Procedure Notes (Signed)
 Anesthesia Regional Block: Interscalene brachial plexus block   Pre-Anesthetic Checklist: , timeout performed,  Correct Patient, Correct Site, Correct Laterality,  Correct Procedure, Correct Position, site marked,  Risks and benefits discussed,  Surgical consent,  Pre-op evaluation,  At surgeon's request and post-op pain management  Laterality: Right  Prep: chloraprep       Needles:  Injection technique: Single-shot  Needle Type: Echogenic Stimulator Needle     Needle Length: 9cm  Needle Gauge: 21     Additional Needles:   Procedures:, nerve stimulator,,, ultrasound used (permanent image in chart),,     Nerve Stimulator or Paresthesia:  Response: deltoid and bicep, 0.5 mA  Additional Responses:   Narrative:  Start time: 03/11/2023 6:53 AM End time: 03/11/2023 6:58 AM Injection made incrementally with aspirations every 5 mL.  Performed by: Personally  Anesthesiologist: Epifanio Charleston, MD

## 2023-03-11 NOTE — Transfer of Care (Signed)
 Immediate Anesthesia Transfer of Care Note  Patient: Monique Morse  Procedure(s) Performed: REVERSE SHOULDER ARTHROPLASTY (Right: Shoulder)  Patient Location: PACU  Anesthesia Type:General  Level of Consciousness: awake, alert , and patient cooperative  Airway & Oxygen Therapy: Patient Spontanous Breathing and Patient connected to face mask oxygen  Post-op Assessment: Report given to RN and Post -op Vital signs reviewed and stable  Post vital signs: Reviewed and stable  Last Vitals:  Vitals Value Taken Time  BP 155/90 03/11/23 0917  Temp 36.7 C 03/11/23 0917  Pulse 80 03/11/23 0919  Resp 15 03/11/23 0919  SpO2 100 % 03/11/23 0919  Vitals shown include unfiled device data.  Last Pain:  Vitals:   03/11/23 0546  TempSrc: Oral  PainSc:          Complications: No notable events documented.

## 2023-03-11 NOTE — Discharge Instructions (Signed)
 Ice to the shoulder constantly.  Keep the incision covered and clean and dry for one week, then ok to get it wet in the shower. Please change your bandage on Sunday to the Aquacel and leave on for the week and then remove that next Friday and leave open to air  Do exercise as instructed several times per day.  DO NOT reach behind your back or push up out of a chair with the operative arm.  Use a sling while you are up and around for comfort, may remove while seated.  Keep pillow propped behind the operative elbow.  Follow up with Dr Kay in two weeks in the office, call [phone] for appt  Please call Dr Kay (cell) [phone] with any questions or concerns

## 2023-03-14 ENCOUNTER — Encounter (HOSPITAL_COMMUNITY): Payer: Self-pay | Admitting: Orthopedic Surgery

## 2023-03-26 IMAGING — MG MM DIGITAL SCREENING BILAT W/ TOMO AND CAD
6 of 10 series · 6 of 30 positions shown · non-contrast
Comparison: Previous exam(s).

CLINICAL DATA: Screening.

EXAM:
DIGITAL SCREENING BILATERAL MAMMOGRAM WITH TOMOSYNTHESIS AND CAD
TECHNIQUE: Bilateral screening digital craniocaudal and mediolateral oblique
mammograms were obtained. Bilateral screening digital breast
tomosynthesis was performed. The images were evaluated with
computer-aided detection.

[L CC synth-2D]
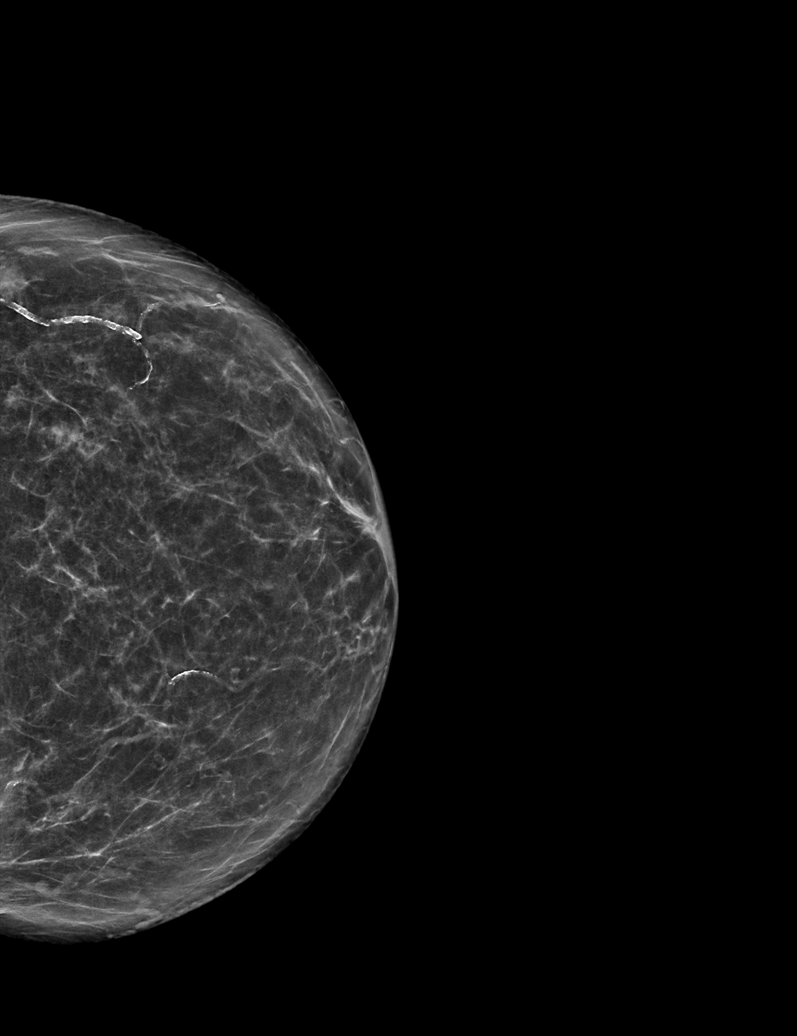

[L MLO synth-2D]
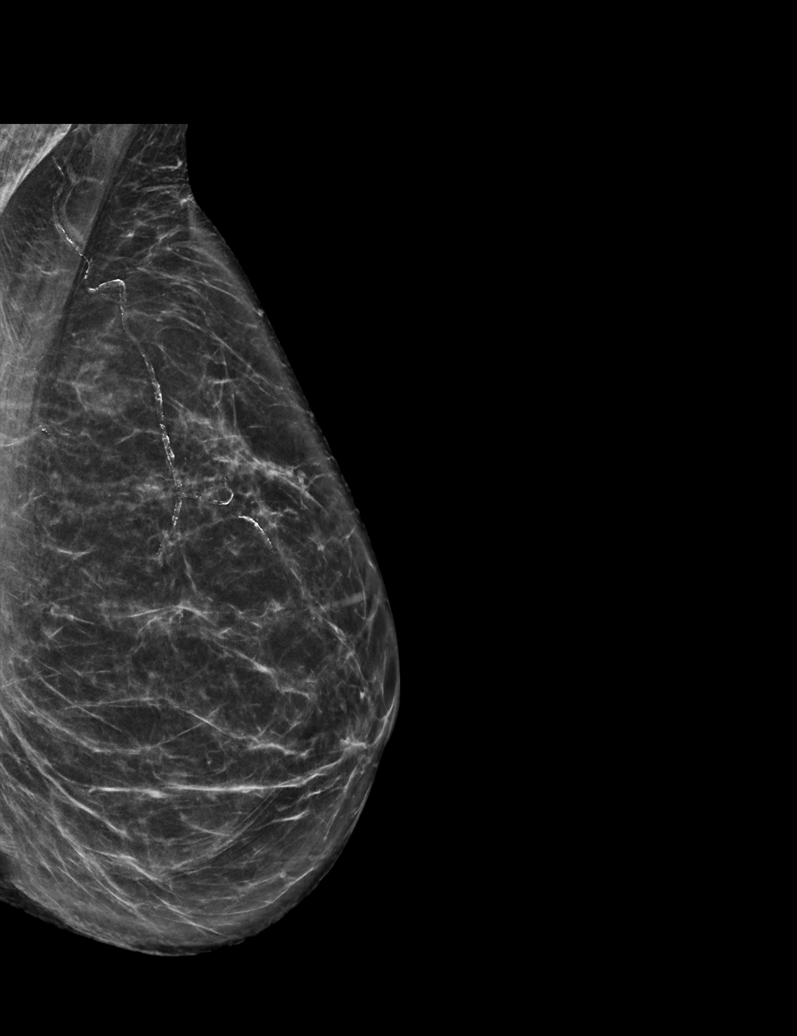

[L XCCL synth-2D]
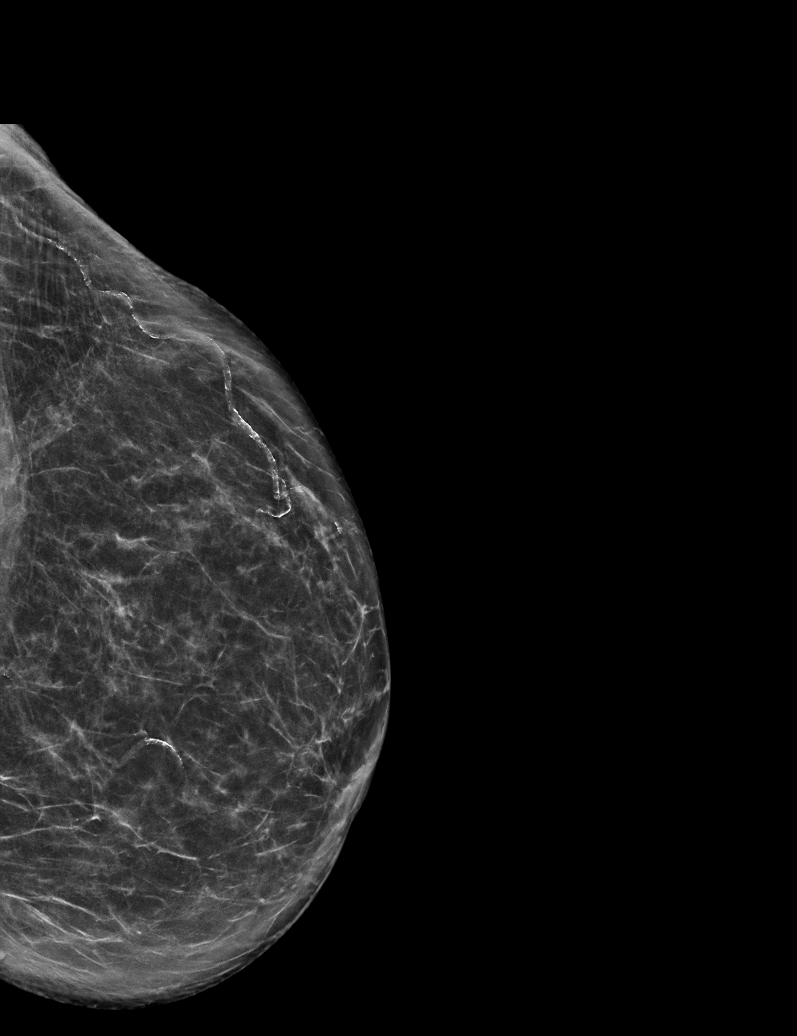

[R MLO synth-2D]
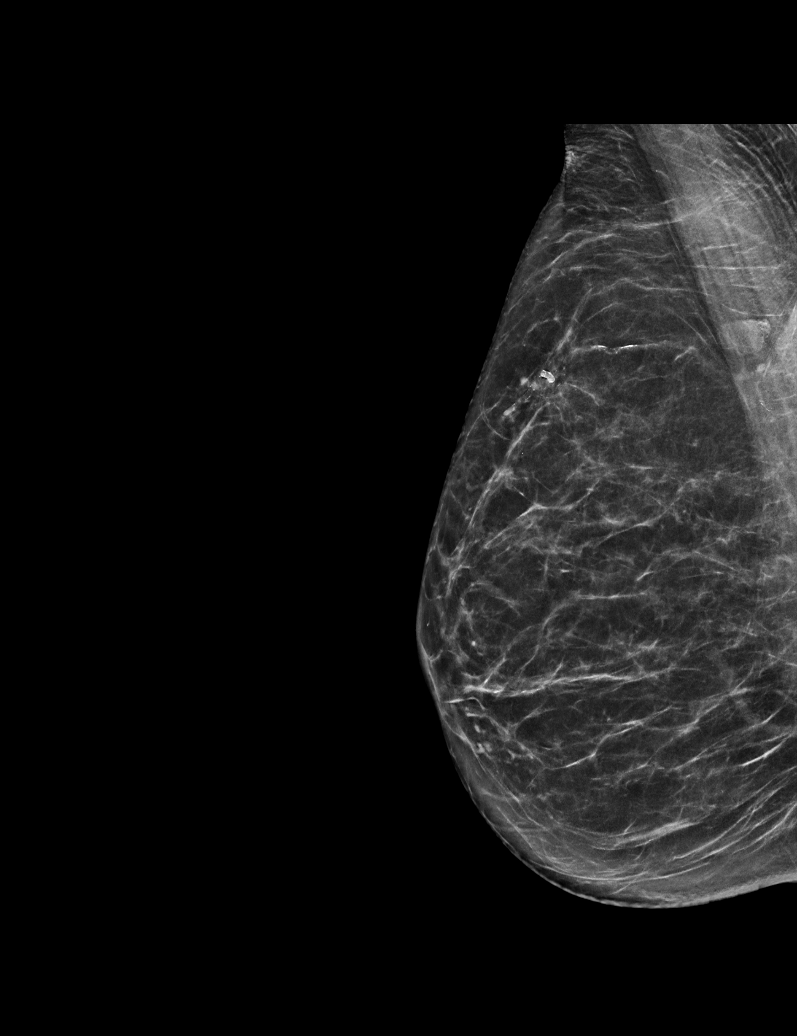

[R CC synth-2D]
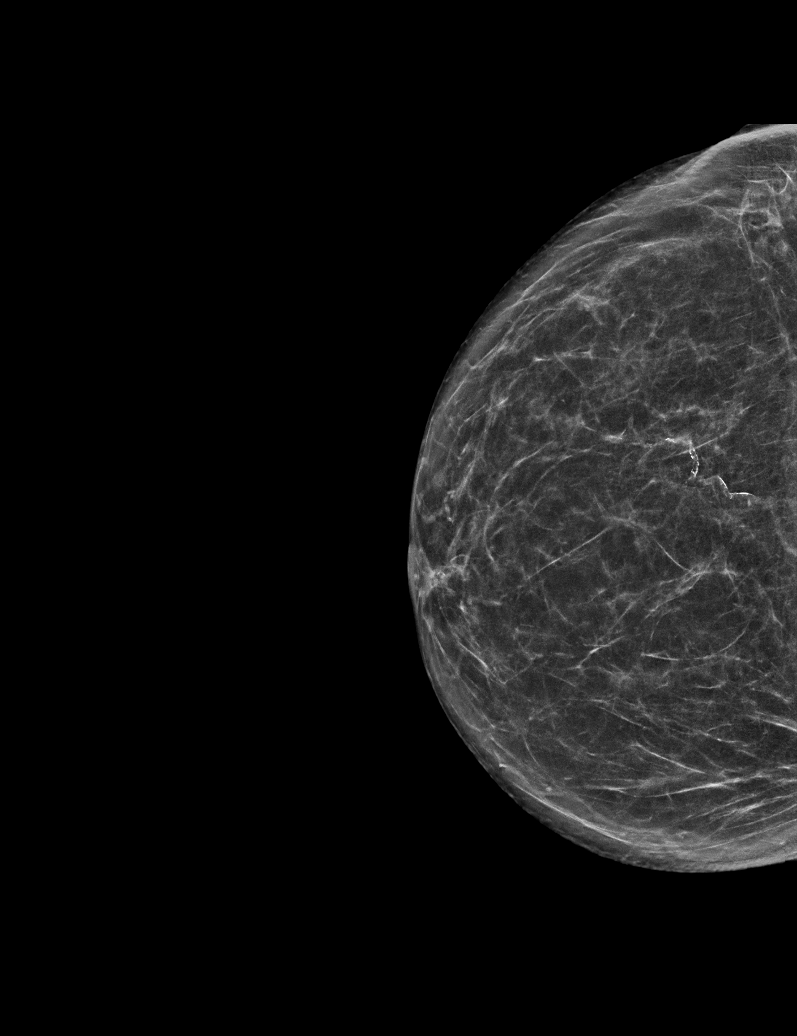

[L XCCL tomo · tomo slice 33/66.0]
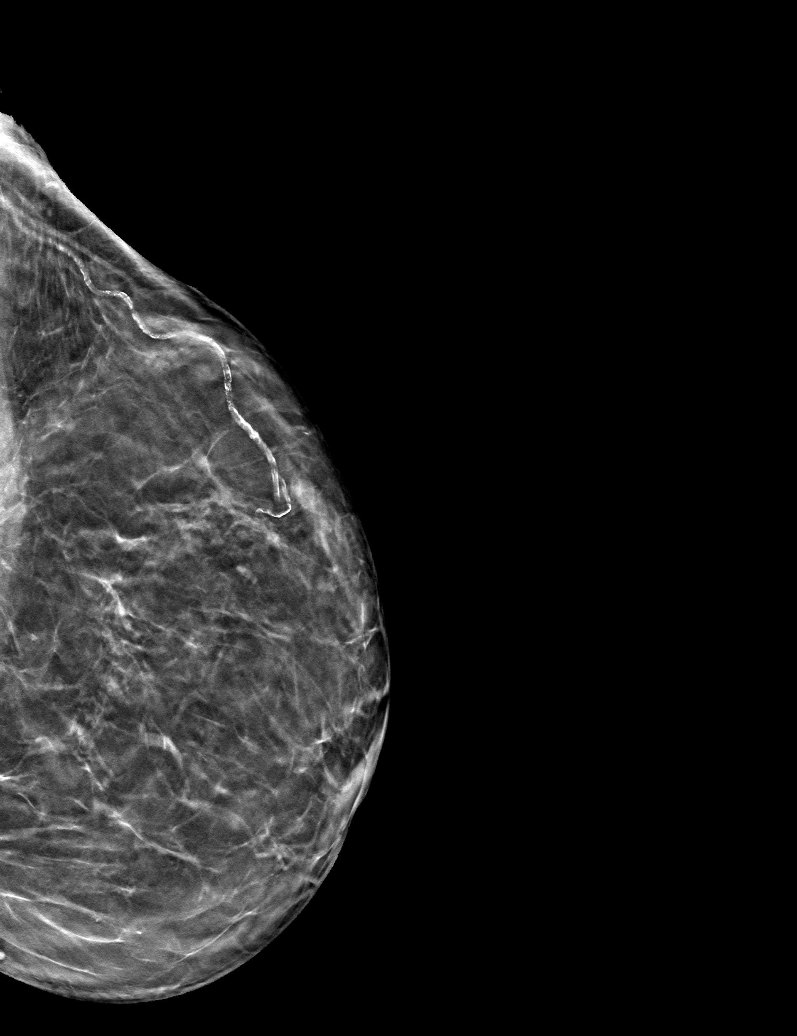

[6 of 30 positions shown; findings below may reference images not displayed]

ACR Breast Density Category c: The breast tissue is heterogeneously
dense, which may obscure small masses.
FINDINGS: There are no findings suspicious for malignancy.
IMPRESSION: No mammographic evidence of malignancy. A result letter of this
screening mammogram will be mailed directly to the patient.

RECOMMENDATION:
Screening mammogram in one year. (Code:Q3-W-BC3)

BI-RADS CATEGORY  1: Negative.

## 2023-06-08 ENCOUNTER — Other Ambulatory Visit: Payer: Self-pay | Admitting: Family Medicine

## 2023-06-08 DIAGNOSIS — Z1231 Encounter for screening mammogram for malignant neoplasm of breast: Secondary | ICD-10-CM

## 2023-07-07 ENCOUNTER — Telehealth: Payer: Self-pay | Admitting: *Deleted

## 2023-07-07 NOTE — Telephone Encounter (Signed)
 LVM to confirm appt for 5/12 - included address and time - [place] 07/07/23

## 2023-07-08 ENCOUNTER — Ambulatory Visit

## 2023-07-11 ENCOUNTER — Encounter (INDEPENDENT_AMBULATORY_CARE_PROVIDER_SITE_OTHER): Payer: Self-pay | Admitting: Otolaryngology

## 2023-07-11 ENCOUNTER — Ambulatory Visit (INDEPENDENT_AMBULATORY_CARE_PROVIDER_SITE_OTHER): Payer: Medicare Other | Admitting: Otolaryngology

## 2023-07-11 ENCOUNTER — Ambulatory Visit
Admission: RE | Admit: 2023-07-11 | Discharge: 2023-07-11 | Disposition: A | Discharge status: Home / Self Care | Source: Ambulatory Visit | Attending: Family Medicine | Admitting: Family Medicine

## 2023-07-11 VITALS — BP 137/92 | HR 80 | Ht 61.0 in | Wt 112.0 lb

## 2023-07-11 DIAGNOSIS — Z1231 Encounter for screening mammogram for malignant neoplasm of breast: Secondary | ICD-10-CM

## 2023-07-11 DIAGNOSIS — H6121 Impacted cerumen, right ear: Secondary | ICD-10-CM | POA: Diagnosis not present

## 2023-07-12 NOTE — Progress Notes (Signed)
 Procedure: Right ear cerumen disimpaction.   Indication: Cerumen impaction, resulting in ear discomfort and conductive hearing loss.   Description: The patient is placed supine on the operating table. Under the operating microscope, the right ear canal is examined and is noted to be impacted with cerumen. The cerumen is carefully removed with a combination of suction catheters, cerumen curette, and alligator forceps. After the cerumen removal, the ear canal and tympanic membrane are noted to be normal. No middle ear effusion is noted.  The left ear canal and tympanic membrane are normal.  The patient tolerated the procedure well.  Follow-up care:  The patient is instructed not to use Q-tips to clean the ear canals. The patient will follow up in 6 months.

## 2024-01-11 ENCOUNTER — Encounter (INDEPENDENT_AMBULATORY_CARE_PROVIDER_SITE_OTHER): Payer: Self-pay | Admitting: Otolaryngology

## 2024-01-11 ENCOUNTER — Ambulatory Visit (INDEPENDENT_AMBULATORY_CARE_PROVIDER_SITE_OTHER): Admitting: Otolaryngology

## 2024-01-11 VITALS — BP 155/89 | HR 88 | Temp 97.8°F | Ht 60.0 in | Wt 112.0 lb

## 2024-01-11 DIAGNOSIS — H6121 Impacted cerumen, right ear: Secondary | ICD-10-CM | POA: Diagnosis not present

## 2024-01-11 NOTE — Progress Notes (Signed)
 Procedure: Right ear cerumen disimpaction.   Indication: Recurrent cerumen impaction, resulting in ear discomfort and conductive hearing loss.   Description: The patient is placed supine on the operating table. Under the operating microscope, the right ear canal is examined and is noted to be impacted with cerumen. The cerumen is carefully removed with a combination of suction catheters, cerumen curette, and alligator forceps. After the cerumen removal, the ear canal and tympanic membrane are noted to be normal. No middle ear effusion is noted.  The left ear canal and tympanic membrane are normal.  The patient tolerated the procedure well.  Follow-up care:  The patient will follow up in 6 months.

## 2024-07-10 ENCOUNTER — Ambulatory Visit (INDEPENDENT_AMBULATORY_CARE_PROVIDER_SITE_OTHER): Admitting: Otolaryngology
